# Patient Record
Sex: Female | Born: 1970 | Race: White | Hispanic: No | Marital: Married | Smoking: Never smoker
Health system: Southern US, Community
[De-identification: ages and names within clinical notes are randomized; demographics above are authoritative.]

## PROBLEM LIST (undated history)

## (undated) DIAGNOSIS — K3184 Gastroparesis: Secondary | ICD-10-CM

## (undated) DIAGNOSIS — K589 Irritable bowel syndrome without diarrhea: Secondary | ICD-10-CM

## (undated) DIAGNOSIS — Z9889 Other specified postprocedural states: Secondary | ICD-10-CM

## (undated) DIAGNOSIS — E041 Nontoxic single thyroid nodule: Secondary | ICD-10-CM

## (undated) DIAGNOSIS — Z803 Family history of malignant neoplasm of breast: Secondary | ICD-10-CM

## (undated) DIAGNOSIS — K219 Gastro-esophageal reflux disease without esophagitis: Secondary | ICD-10-CM

## (undated) DIAGNOSIS — Z8639 Personal history of other endocrine, nutritional and metabolic disease: Secondary | ICD-10-CM

## (undated) DIAGNOSIS — R110 Nausea: Secondary | ICD-10-CM

## (undated) DIAGNOSIS — J302 Other seasonal allergic rhinitis: Secondary | ICD-10-CM

## (undated) DIAGNOSIS — R112 Nausea with vomiting, unspecified: Secondary | ICD-10-CM

## (undated) DIAGNOSIS — E039 Hypothyroidism, unspecified: Secondary | ICD-10-CM

## (undated) HISTORY — DX: Gastroparesis: K31.84

## (undated) HISTORY — PX: WISDOM TOOTH EXTRACTION: SHX21

## (undated) HISTORY — PX: THYROIDECTOMY, PARTIAL: SHX18

## (undated) HISTORY — DX: Irritable bowel syndrome without diarrhea: K58.9

## (undated) HISTORY — PX: CHOLECYSTECTOMY: SHX55

## (undated) HISTORY — PX: EYE SURGERY: SHX253

## (undated) HISTORY — DX: Personal history of other endocrine, nutritional and metabolic disease: Z86.39

## (undated) HISTORY — DX: Nausea: R11.0

---
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
## 2001-07-02 ENCOUNTER — Other Ambulatory Visit: Admission: RE | Admit: 2001-07-02 | Discharge: 2001-07-02 | Payer: Self-pay | Admitting: Obstetrics and Gynecology

## 2002-01-27 ENCOUNTER — Other Ambulatory Visit: Admission: RE | Admit: 2002-01-27 | Discharge: 2002-01-27 | Payer: Self-pay | Admitting: Obstetrics and Gynecology

## 2003-03-19 ENCOUNTER — Other Ambulatory Visit: Admission: RE | Admit: 2003-03-19 | Discharge: 2003-03-19 | Payer: Self-pay | Admitting: Obstetrics and Gynecology

## 2003-11-19 ENCOUNTER — Other Ambulatory Visit: Admission: RE | Admit: 2003-11-19 | Discharge: 2003-11-19 | Payer: Self-pay | Admitting: Obstetrics and Gynecology

## 2004-02-01 ENCOUNTER — Inpatient Hospital Stay (HOSPITAL_COMMUNITY): Admission: AD | Admit: 2004-02-01 | Discharge: 2004-02-02 | Payer: Self-pay | Admitting: Obstetrics and Gynecology

## 2004-04-05 ENCOUNTER — Inpatient Hospital Stay (HOSPITAL_COMMUNITY): Admission: RE | Admit: 2004-04-05 | Discharge: 2004-04-08 | Payer: Self-pay | Admitting: Obstetrics and Gynecology

## 2004-05-13 ENCOUNTER — Inpatient Hospital Stay (HOSPITAL_COMMUNITY): Admission: AD | Admit: 2004-05-13 | Discharge: 2004-05-16 | Payer: Self-pay | Admitting: Obstetrics and Gynecology

## 2004-05-14 ENCOUNTER — Encounter (INDEPENDENT_AMBULATORY_CARE_PROVIDER_SITE_OTHER): Payer: Self-pay | Admitting: Specialist

## 2004-05-19 ENCOUNTER — Inpatient Hospital Stay (HOSPITAL_COMMUNITY): Admission: AD | Admit: 2004-05-19 | Discharge: 2004-05-19 | Payer: Self-pay

## 2004-05-21 ENCOUNTER — Encounter: Admission: RE | Admit: 2004-05-21 | Discharge: 2004-06-20 | Payer: Self-pay | Admitting: Obstetrics and Gynecology

## 2004-06-23 ENCOUNTER — Other Ambulatory Visit: Admission: RE | Admit: 2004-06-23 | Discharge: 2004-06-23 | Payer: Self-pay | Admitting: Obstetrics and Gynecology

## 2004-08-25 ENCOUNTER — Ambulatory Visit: Payer: Self-pay | Admitting: Family Medicine

## 2004-12-14 ENCOUNTER — Ambulatory Visit: Payer: Self-pay | Admitting: Internal Medicine

## 2005-02-23 ENCOUNTER — Ambulatory Visit: Payer: Self-pay | Admitting: Internal Medicine

## 2005-04-03 ENCOUNTER — Other Ambulatory Visit: Admission: RE | Admit: 2005-04-03 | Discharge: 2005-04-03 | Payer: Self-pay | Admitting: Obstetrics and Gynecology

## 2005-06-27 ENCOUNTER — Encounter: Admission: RE | Admit: 2005-06-27 | Discharge: 2005-06-27 | Payer: Self-pay | Admitting: Family Medicine

## 2005-06-27 ENCOUNTER — Ambulatory Visit: Payer: Self-pay | Admitting: Family Medicine

## 2005-09-27 ENCOUNTER — Ambulatory Visit: Payer: Self-pay | Admitting: Internal Medicine

## 2005-10-23 ENCOUNTER — Other Ambulatory Visit: Admission: RE | Admit: 2005-10-23 | Discharge: 2005-10-23 | Payer: Self-pay | Admitting: Obstetrics and Gynecology

## 2005-11-29 ENCOUNTER — Ambulatory Visit: Payer: Self-pay | Admitting: Internal Medicine

## 2005-11-30 ENCOUNTER — Ambulatory Visit: Payer: Self-pay | Admitting: Internal Medicine

## 2006-03-20 ENCOUNTER — Ambulatory Visit: Payer: Self-pay | Admitting: Internal Medicine

## 2006-03-21 ENCOUNTER — Emergency Department (HOSPITAL_COMMUNITY): Admission: EM | Admit: 2006-03-21 | Discharge: 2006-03-22 | Payer: Self-pay | Admitting: Emergency Medicine

## 2006-03-23 ENCOUNTER — Inpatient Hospital Stay (HOSPITAL_COMMUNITY): Admission: EM | Admit: 2006-03-23 | Discharge: 2006-03-25 | Payer: Self-pay | Admitting: Emergency Medicine

## 2006-03-25 ENCOUNTER — Encounter (INDEPENDENT_AMBULATORY_CARE_PROVIDER_SITE_OTHER): Payer: Self-pay | Admitting: Specialist

## 2006-05-02 ENCOUNTER — Encounter: Admission: RE | Admit: 2006-05-02 | Discharge: 2006-05-02 | Payer: Self-pay | Admitting: Gastroenterology

## 2006-06-26 ENCOUNTER — Ambulatory Visit: Payer: Self-pay | Admitting: Internal Medicine

## 2007-06-17 ENCOUNTER — Encounter: Payer: Self-pay | Admitting: Internal Medicine

## 2007-08-06 ENCOUNTER — Ambulatory Visit: Payer: Self-pay | Admitting: Internal Medicine

## 2007-08-06 DIAGNOSIS — K219 Gastro-esophageal reflux disease without esophagitis: Secondary | ICD-10-CM | POA: Insufficient documentation

## 2007-08-06 DIAGNOSIS — E039 Hypothyroidism, unspecified: Secondary | ICD-10-CM

## 2007-10-03 ENCOUNTER — Encounter (INDEPENDENT_AMBULATORY_CARE_PROVIDER_SITE_OTHER): Payer: Self-pay | Admitting: *Deleted

## 2007-10-03 ENCOUNTER — Ambulatory Visit: Payer: Self-pay | Admitting: Internal Medicine

## 2007-10-07 ENCOUNTER — Ambulatory Visit: Payer: Self-pay | Admitting: Internal Medicine

## 2007-10-07 ENCOUNTER — Encounter (INDEPENDENT_AMBULATORY_CARE_PROVIDER_SITE_OTHER): Payer: Self-pay | Admitting: *Deleted

## 2007-10-21 ENCOUNTER — Encounter: Payer: Self-pay | Admitting: Internal Medicine

## 2007-11-11 ENCOUNTER — Telehealth (INDEPENDENT_AMBULATORY_CARE_PROVIDER_SITE_OTHER): Payer: Self-pay | Admitting: *Deleted

## 2008-06-01 ENCOUNTER — Ambulatory Visit: Payer: Self-pay | Admitting: Internal Medicine

## 2008-08-28 ENCOUNTER — Ambulatory Visit: Payer: Self-pay | Admitting: Family Medicine

## 2008-09-08 ENCOUNTER — Telehealth: Payer: Self-pay | Admitting: Internal Medicine

## 2008-11-26 ENCOUNTER — Telehealth (INDEPENDENT_AMBULATORY_CARE_PROVIDER_SITE_OTHER): Payer: Self-pay | Admitting: *Deleted

## 2008-12-11 ENCOUNTER — Ambulatory Visit: Payer: Self-pay | Admitting: Family Medicine

## 2008-12-15 ENCOUNTER — Ambulatory Visit: Payer: Self-pay | Admitting: Internal Medicine

## 2008-12-15 ENCOUNTER — Telehealth (INDEPENDENT_AMBULATORY_CARE_PROVIDER_SITE_OTHER): Payer: Self-pay | Admitting: *Deleted

## 2009-04-29 ENCOUNTER — Telehealth: Payer: Self-pay | Admitting: Internal Medicine

## 2009-06-30 ENCOUNTER — Ambulatory Visit: Payer: Self-pay | Admitting: Family

## 2009-07-06 ENCOUNTER — Encounter: Payer: Self-pay | Admitting: Internal Medicine

## 2009-09-06 ENCOUNTER — Ambulatory Visit: Payer: Self-pay | Admitting: Family

## 2009-09-06 ENCOUNTER — Ambulatory Visit (HOSPITAL_BASED_OUTPATIENT_CLINIC_OR_DEPARTMENT_OTHER): Admission: RE | Admit: 2009-09-06 | Discharge: 2009-09-06 | Payer: Self-pay | Admitting: Internal Medicine

## 2009-09-06 ENCOUNTER — Ambulatory Visit: Payer: Self-pay | Admitting: Diagnostic Radiology

## 2009-09-06 ENCOUNTER — Telehealth: Payer: Self-pay | Admitting: Family

## 2009-10-13 ENCOUNTER — Ambulatory Visit: Payer: Self-pay | Admitting: Family

## 2009-10-13 LAB — CONVERTED CEMR LAB
CO2: 28 meq/L (ref 19–32)
Chloride: 106 meq/L (ref 96–112)
Glucose, Bld: 91 mg/dL (ref 70–99)
Potassium: 4.1 meq/L (ref 3.5–5.1)
Sodium: 139 meq/L (ref 135–145)

## 2009-10-18 ENCOUNTER — Telehealth: Payer: Self-pay | Admitting: Family

## 2009-10-18 ENCOUNTER — Ambulatory Visit: Payer: Self-pay | Admitting: Family

## 2009-10-18 ENCOUNTER — Encounter: Admission: RE | Admit: 2009-10-18 | Discharge: 2009-10-18 | Payer: Self-pay | Admitting: Internal Medicine

## 2009-10-18 DIAGNOSIS — K3184 Gastroparesis: Secondary | ICD-10-CM

## 2009-10-18 LAB — CONVERTED CEMR LAB
ALT: 57 units/L — ABNORMAL HIGH (ref 0–35)
AST: 67 units/L — ABNORMAL HIGH (ref 0–37)
Amylase: 47 units/L (ref 27–131)
Basophils Absolute: 0 10*3/uL (ref 0.0–0.1)
Bilirubin, Direct: 0.1 mg/dL (ref 0.0–0.3)
Eosinophils Absolute: 0.1 10*3/uL (ref 0.0–0.7)
GFR calc non Af Amer: 65.85 mL/min (ref >60)
Lipase: 15 units/L (ref 11.0–59.0)
Lymphocytes Relative: 31.6 % (ref 12.0–46.0)
Lymphs Abs: 2.9 10*3/uL (ref 0.7–4.0)
MCHC: 32.9 g/dL (ref 30.0–36.0)
MCV: 83.3 fL (ref 78.0–100.0)
Neutrophils Relative %: 61 % (ref 43.0–77.0)
Potassium: 4.8 meq/L (ref 3.5–5.1)
RDW: 14.3 % (ref 11.5–14.6)
Sodium: 139 meq/L (ref 135–145)
Total Bilirubin: 0.3 mg/dL (ref 0.3–1.2)
Total Protein: 8 g/dL (ref 6.0–8.3)
WBC: 9.1 10*3/uL (ref 4.5–10.5)
hCG, Beta Chain, Quant, S: <0.5 milliintl units/mL

## 2009-10-19 ENCOUNTER — Encounter: Payer: Self-pay | Admitting: Family

## 2009-10-20 ENCOUNTER — Encounter: Payer: Self-pay | Admitting: Family Medicine

## 2009-10-20 ENCOUNTER — Telehealth: Payer: Self-pay | Admitting: Family

## 2009-10-20 ENCOUNTER — Ambulatory Visit: Payer: Self-pay | Admitting: Family

## 2009-10-20 LAB — CONVERTED CEMR LAB
AST: 52 units/L — ABNORMAL HIGH (ref 0–37)
Bilirubin, Direct: 0.1 mg/dL (ref 0.0–0.3)
Eosinophils Relative: 1.2 % (ref 0.0–5.0)
Hemoglobin: 13.1 g/dL (ref 12.0–15.0)
Lymphocytes Relative: 36.2 % (ref 12.0–46.0)
Lymphs Abs: 2.5 10*3/uL (ref 0.7–4.0)
Monocytes Absolute: 0.5 10*3/uL (ref 0.1–1.0)
Monocytes Relative: 7.7 % (ref 3.0–12.0)
Neutro Abs: 3.8 10*3/uL (ref 1.4–7.7)
Platelets: 227 10*3/uL (ref 150.0–400.0)
Total Bilirubin: 0.6 mg/dL (ref 0.3–1.2)

## 2009-10-21 ENCOUNTER — Encounter: Payer: Self-pay | Admitting: Internal Medicine

## 2009-10-21 ENCOUNTER — Telehealth (INDEPENDENT_AMBULATORY_CARE_PROVIDER_SITE_OTHER): Payer: Self-pay | Admitting: *Deleted

## 2009-10-21 LAB — CONVERTED CEMR LAB
Hep A IgM: NEGATIVE
Hep B C IgM: NEGATIVE

## 2009-10-22 LAB — CONVERTED CEMR LAB
HCV Ab: NEGATIVE
Hep A IgM: NEGATIVE

## 2009-10-29 ENCOUNTER — Telehealth: Payer: Self-pay | Admitting: Family

## 2009-10-29 ENCOUNTER — Encounter: Payer: Self-pay | Admitting: Family

## 2009-12-08 ENCOUNTER — Encounter: Payer: Self-pay | Admitting: Internal Medicine

## 2010-03-09 ENCOUNTER — Ambulatory Visit: Payer: Self-pay | Admitting: Family Medicine

## 2010-03-09 DIAGNOSIS — J309 Allergic rhinitis, unspecified: Secondary | ICD-10-CM | POA: Insufficient documentation

## 2010-03-28 ENCOUNTER — Ambulatory Visit: Payer: Self-pay | Admitting: Internal Medicine

## 2010-03-30 ENCOUNTER — Telehealth (INDEPENDENT_AMBULATORY_CARE_PROVIDER_SITE_OTHER): Payer: Self-pay | Admitting: *Deleted

## 2010-04-11 ENCOUNTER — Ambulatory Visit (HOSPITAL_COMMUNITY): Admission: RE | Admit: 2010-04-11 | Discharge: 2010-04-11 | Payer: Self-pay | Admitting: Obstetrics and Gynecology

## 2010-04-14 ENCOUNTER — Telehealth: Payer: Self-pay | Admitting: Internal Medicine

## 2010-05-02 ENCOUNTER — Ambulatory Visit: Payer: Self-pay | Admitting: Internal Medicine

## 2010-05-02 DIAGNOSIS — R5381 Other malaise: Secondary | ICD-10-CM

## 2010-05-02 DIAGNOSIS — F329 Major depressive disorder, single episode, unspecified: Secondary | ICD-10-CM

## 2010-05-02 DIAGNOSIS — R5383 Other fatigue: Secondary | ICD-10-CM

## 2010-05-03 ENCOUNTER — Encounter: Payer: Self-pay | Admitting: Internal Medicine

## 2010-05-03 ENCOUNTER — Telehealth: Payer: Self-pay | Admitting: Internal Medicine

## 2010-05-05 ENCOUNTER — Ambulatory Visit: Payer: Self-pay | Admitting: Internal Medicine

## 2010-05-09 LAB — CONVERTED CEMR LAB
ALT: 52 units/L — ABNORMAL HIGH (ref 0–35)
AST: 79 units/L — ABNORMAL HIGH (ref 0–37)
Albumin: 4.1 g/dL (ref 3.5–5.2)
Alkaline Phosphatase: 55 units/L (ref 39–117)
Basophils Absolute: 0 10*3/uL (ref 0.0–0.1)
Basophils Relative: 0.6 % (ref 0.0–3.0)
Bilirubin, Direct: 0.1 mg/dL (ref 0.0–0.3)
Eosinophils Absolute: 0 10*3/uL (ref 0.0–0.7)
Eosinophils Relative: 0.6 % (ref 0.0–5.0)
Free T4: 1.13 ng/dL (ref 0.60–1.60)
HCT: 38.3 % (ref 36.0–46.0)
Hemoglobin: 12.8 g/dL (ref 12.0–15.0)
Lymphocytes Relative: 52.7 % — ABNORMAL HIGH (ref 12.0–46.0)
Lymphs Abs: 3.8 10*3/uL (ref 0.7–4.0)
MCHC: 33.3 g/dL (ref 30.0–36.0)
MCV: 81.5 fL (ref 78.0–100.0)
Monocytes Absolute: 0.9 10*3/uL (ref 0.1–1.0)
Monocytes Relative: 13 % — ABNORMAL HIGH (ref 3.0–12.0)
Neutro Abs: 2.4 10*3/uL (ref 1.4–7.7)
Neutrophils Relative %: 33.1 % — ABNORMAL LOW (ref 43.0–77.0)
Platelets: 248 10*3/uL (ref 150.0–400.0)
RBC: 4.7 M/uL (ref 3.87–5.11)
RDW: 14.4 % (ref 11.5–14.6)
T3, Free: 2.9 pg/mL (ref 2.3–4.2)
TSH: 0.19 microintl units/mL — ABNORMAL LOW (ref 0.35–5.50)
Total Bilirubin: 0.4 mg/dL (ref 0.3–1.2)
Total Protein: 7.8 g/dL (ref 6.0–8.3)
WBC: 7.1 10*3/uL (ref 4.5–10.5)

## 2010-05-10 ENCOUNTER — Ambulatory Visit: Payer: Self-pay | Admitting: Internal Medicine

## 2010-05-10 DIAGNOSIS — R740 Nonspecific elevation of levels of transaminase and lactic acid dehydrogenase [LDH]: Secondary | ICD-10-CM

## 2010-05-11 LAB — CONVERTED CEMR LAB
ALT: 78 units/L — ABNORMAL HIGH (ref 0–35)
AST: 91 units/L — ABNORMAL HIGH (ref 0–37)
Alkaline Phosphatase: 57 units/L (ref 39–117)
BUN: 12 mg/dL (ref 6–23)
Bilirubin, Direct: 0.2 mg/dL (ref 0.0–0.3)
Chloride: 108 meq/L (ref 96–112)
Eosinophils Absolute: 0 10*3/uL (ref 0.0–0.7)
GFR calc non Af Amer: 68 mL/min (ref >60)
HCT: 38.5 % (ref 36.0–46.0)
Hemoglobin: 12.9 g/dL (ref 12.0–15.0)
MCHC: 33.5 g/dL (ref 30.0–36.0)
Neutro Abs: 5 10*3/uL (ref 1.4–7.7)
Platelets: 249 10*3/uL (ref 150.0–400.0)
Potassium: 4.2 meq/L (ref 3.5–5.1)
RBC: 4.74 M/uL (ref 3.87–5.11)
RDW: 14.1 % (ref 11.5–14.6)
Total Bilirubin: 0.8 mg/dL (ref 0.3–1.2)

## 2010-05-12 ENCOUNTER — Encounter (INDEPENDENT_AMBULATORY_CARE_PROVIDER_SITE_OTHER): Payer: Self-pay | Admitting: *Deleted

## 2010-05-12 ENCOUNTER — Encounter: Payer: Self-pay | Admitting: Internal Medicine

## 2010-05-12 ENCOUNTER — Telehealth (INDEPENDENT_AMBULATORY_CARE_PROVIDER_SITE_OTHER): Payer: Self-pay | Admitting: *Deleted

## 2010-05-16 ENCOUNTER — Telehealth: Payer: Self-pay | Admitting: Internal Medicine

## 2010-05-16 ENCOUNTER — Encounter: Payer: Self-pay | Admitting: Internal Medicine

## 2010-05-31 ENCOUNTER — Ambulatory Visit: Payer: Self-pay | Admitting: Internal Medicine

## 2010-05-31 ENCOUNTER — Encounter (INDEPENDENT_AMBULATORY_CARE_PROVIDER_SITE_OTHER): Payer: Self-pay | Admitting: *Deleted

## 2010-06-03 ENCOUNTER — Encounter: Admission: RE | Admit: 2010-06-03 | Discharge: 2010-06-03 | Payer: Self-pay | Admitting: Gastroenterology

## 2010-06-08 ENCOUNTER — Encounter: Payer: Self-pay | Admitting: Internal Medicine

## 2010-06-08 ENCOUNTER — Ambulatory Visit: Payer: Self-pay | Admitting: Family Medicine

## 2010-06-10 ENCOUNTER — Telehealth: Payer: Self-pay | Admitting: Internal Medicine

## 2010-06-15 ENCOUNTER — Telehealth: Payer: Self-pay | Admitting: Internal Medicine

## 2010-06-15 ENCOUNTER — Ambulatory Visit: Payer: Self-pay | Admitting: Internal Medicine

## 2010-06-20 ENCOUNTER — Encounter (INDEPENDENT_AMBULATORY_CARE_PROVIDER_SITE_OTHER): Payer: Self-pay | Admitting: *Deleted

## 2010-06-20 ENCOUNTER — Ambulatory Visit: Payer: Self-pay | Admitting: Internal Medicine

## 2010-06-22 ENCOUNTER — Ambulatory Visit: Payer: Self-pay | Admitting: Cardiovascular Disease

## 2010-06-27 ENCOUNTER — Telehealth: Payer: Self-pay | Admitting: Internal Medicine

## 2010-06-28 ENCOUNTER — Telehealth: Payer: Self-pay | Admitting: Internal Medicine

## 2010-07-20 ENCOUNTER — Telehealth (INDEPENDENT_AMBULATORY_CARE_PROVIDER_SITE_OTHER): Payer: Self-pay | Admitting: *Deleted

## 2010-09-03 ENCOUNTER — Other Ambulatory Visit: Payer: Self-pay | Admitting: Endocrinology

## 2010-09-03 DIAGNOSIS — E041 Nontoxic single thyroid nodule: Secondary | ICD-10-CM

## 2010-09-13 NOTE — Progress Notes (Signed)
Summary: Still coughing  Phone Note Call from Patient Call back at Home Phone [phone] Call back at Work Phone [phone]   Caller: Patient Summary of Call: Pt states that she is finished with the ATB and her cough has gotten a lot better. But at night she is still having to take the Tussonex to keep from coughing at night. She states that the air quality at work is not the best. They are dealing with some really old files that could have some mold on them. Should she continue with the Mucinex DM and the cough syrup at night or should she come back in and be reevaluated. Army Fossa CMA  April 14, 2010 1:23 PM   Follow-up for Phone Call        yes, continue w/ mucinex dm and the syrup. also get samples of symbicort (80 if available, if not 160) 2 puffs two times a day x 2 weeks Leniyah Martell E. Jaden Abreu MD  April 15, 2010 8:10 AM   Additional Follow-up for Phone Call Additional follow up Details #1::        I spoke with pt she is aware, samples upfront. Army Fossa CMA  April 15, 2010 8:51 AM     New/Updated Medications: SYMBICORT 80-4.5 MCG/ACT AERO (BUDESONIDE-FORMOTEROL FUMARATE) 2 puffs two times a day for 2 weeks. Prescriptions: SYMBICORT 80-4.5 MCG/ACT AERO (BUDESONIDE-FORMOTEROL FUMARATE) 2 puffs two times a day for 2 weeks.  #1 x 0   Entered by:   Army Fossa CMA   Authorized by:   Nolon Rod. Hikaru Delorenzo MD   Signed by:   Army Fossa CMA on 04/15/2010   Method used:   Samples Given   RxID:   [id number]

## 2010-09-13 NOTE — Progress Notes (Signed)
Summary: recommendations  Phone Note Call from Patient Call back at Home Phone [phone]   Summary of Call: Patient left message on triage that she did see Dr. Bosie Clos and her CT, endoscopy, and colonoscopy were all negative. Patient would like to know what you want to do in regards to the Fluoxetine. Patient also notes that she still has N&V. Please advise. Initial call taken by: Lucious Groves CMA,  June 10, 2010 11:43 AM  Follow-up for Phone Call         decrease fluoxetine to half tablet for 10 days, then discontinue. We'll see how she does without it Jose E. Paz MD  June 10, 2010 12:57 PM   Additional Follow-up for Phone Call Additional follow up Details #1::        Patient notified.  Additional Follow-up by: Lucious Groves CMA,  June 10, 2010 2:14 PM

## 2010-09-13 NOTE — Assessment & Plan Note (Signed)
Summary: still not feeling well/throwing up//lch   Vital Signs:  Patient profile:   40 year old female Weight:      170 pounds BMI:     30.46 Temp:     99.7 degrees F p Pulse rate:   100 / minute Pulse rhythm:   regular Resp:     16 per minute BP sitting:   90 / 60  (left arm) Cuff size:   large  Vitals Entered By: Mervin Kung CMA (October 18, 2009 11:19 AM) CC: room 17  Vomiting since Sunday.   CC:  room 17  Vomiting since Sunday.Marland Kitchen  History of Present Illness: Ms Appleton is a 40 year old female who presents today with ongoing nausea, and vomitting.  Denies diarrhea.  Has been using zofran which "dulls" the nausea, but still having episodes of vomitting. She notes that wed through saturday had nausea without vomitting.  Had recurrent episode of vomitting last night.   Denies abdominal pain, diarrhea or cramping.  Husband had some nausea yesterday.  She is sipping 2-3 16 oz of bottled water, urinating regularly.  Notes +dry mouth.  Low grade temp.    Allergies: 1)  ! * Topical Hydrocortisone  Past History:  Past Medical History: Last updated: 08/06/2007 Hypothyroidism GERD  Past Surgical History: Last updated: 08/06/2007 Thyroidectomy, partial Cholecystectomy  Social History: Last updated: 12/15/2008 Married 2 girls, 64.40 years old - twins   Risk Factors: Smoking Status: never (08/06/2007)  Physical Exam  General:  Well-developed,well-nourished,in no acute distress; alert,appropriate and cooperative throughout examination Head:  Normocephalic and atraumatic without obvious abnormalities. No apparent alopecia or balding. Lungs:  Normal respiratory effort, chest expands symmetrically. Lungs are clear to auscultation, no crackles or wheezes. Heart:  Normal rate and regular rhythm. S1 and S2 normal without gallop, murmur, click, rub or other extra sounds. Abdomen:  Bowel sounds positive,abdomen soft and non-tender without masses, organomegaly or hernias  noted. Psych:  Cognition and judgment appear intact. Alert and cooperative with normal attention span and concentration. No apparent delusions, illusions, hallucinations   Impression & Recommendations:  Problem # 1:  NAUSEA AND VOMITING (ICD-787.01) Assessment Unchanged Pt with ongoing nausea and intermittent vomitting- low grade temp.  Pt is s/p cholecystectomy.  Abdominal US notes normal CBD, pancreas poorly visualized due to bowel gas.  LFT's are mildly elevated.  Will add on acute hepatitis panel to patient's lab work.  Patient is maintained on a PPI.  Amylase and lipase are normal.  I suspect that she is suffering from a prolonged viral gastroenteritis.  Plan f/u in 3 days.  Orthostatic BP's were checked in the room and patient did not demonstrated orthostasis.  Orders: Venipuncture ([address]) Misc. Referral (Misc. Ref) TLB-BMP (Basic Metabolic Panel-BMET) (80048-METABOL) TLB-CBC Platelet - w/Differential (85025-CBCD) TLB-Hepatic/Liver Function Pnl (80076-HEPATIC) TLB-Amylase (82150-AMYL) TLB-Lipase (83690-LIPASE) TLB-Preg Serum Quant (B-hCG) (84702-HCG-QN)  Complete Medication List: 1)  Levothroid 50 Mcg Tabs (Levothyroxine sodium) .Marland Kitchen.. 1 by mouth qd 2)  Protonix 40 Mg Tbec (Pantoprazole sodium) .... Qd 3)  Fluticasone Propionate 50 Mcg/act Susp (Fluticasone propionate) .... 2 sprays each nostril once daily 4)  Loratadine 10 Mg Tabs (Loratadine) .... Once daily as needed 5)  Ovcon-50 50-1 Mcg-mg Tabs (Norethindrone-eth estradiol) .Marland Kitchen.. 1 by mouth daily 6)  Zofran 4 Mg Tabs (Ondansetron hcl) .... One tablet by mouth every 8 hour as needed for nausea  Patient Instructions: 1)  Please complete your ultrasound today.  2)  Follow up on Wednesday 3/9, if symptoms worsen between now  and then, please go directly to the ER.  Current Allergies (reviewed today): ! * TOPICAL HYDROCORTISONE

## 2010-09-13 NOTE — Procedures (Signed)
Summary: Colonoscopy---neg   Colonoscopy/[facility]   Imported By: Lanelle Bal 07/12/2010 09:44:52  _____________________________________________________________________  External Attachment:    Type:   Image     Comment:   External Document

## 2010-09-13 NOTE — Assessment & Plan Note (Signed)
Summary: DISCUSS DEPRESSION,MEDS AND SIDE EFFECTS, AND RETURNING TO WO...   Vital Signs:  Patient profile:   40 year old female Weight:      161.25 pounds Pulse rate:   126 / minute Pulse rhythm:   regular BP sitting:   126 / 86  (left arm) Cuff size:   large  Vitals Entered By: Army Fossa CMA (June 20, 2010 11:24 AM) CC: Pt here for f/u on meds.  Comments Still having nausea spells- not as bad Not vomitted since friday not fasting    History of Present Illness: CHART REVIEWED:  3-11 prolonged nausea vomiting, abdominal ultrasound negative a except for fatty liver, LFTs  elevated, acute hepatitis and is negative; saw  GI, PPIs were switched. symptoms eventually resolved  05-02-2010 was seen in the office with emotional distress, started fluoxetine 05-06-2010 started with watery diarrhea,  mild nausea 05-10-2010, saw Dr Alwyn Ren with above  symptoms, extensive blood work showed again increased LFTs, stool studies negative 05-16-10 saw  GI, they prescribed align  05-22-10 diarrhea finally subsided 10-10 to  10-14 she felt relatively well and went to work 05-28-10 GI symptoms resurface: Nausea, vomiting,   moderate to severe acid reflux The patient called GI, they increase Protonix to b.i.d. and prescribed Zofran we added align-bentyl 06-10-10 patient called, reportedly Dr. Bosie Clos did  blood tests,  CT of the abdomen,  EGD (HH, stable, bx showed "inflamation"), and colonoscopy were all negative.  b/c her symptoms continue, I rec to to tapper down fluoxetine 06-14-10 saw endocrinology for thyroid dz, labs done  SHE IS HERE FOR F/U over all feels better Decrease nausea, still uses  Zofran as needed Has not vomited for 3 days No diarrhea, stools are slightly loose still No headaches in 4 days She still has hot  flashes ,  described as episodes of sweating  Current Medications (verified): 1)  Levothroid 50 Mcg Tabs (Levothyroxine Sodium) .Marland Kitchen.. 1 By Mouth Once Daily 2)   Protonix 40 Mg  Tbec (Pantoprazole Sodium) .... Twice A Day 3)  Fluticasone Propionate 50 Mcg/act  Susp (Fluticasone Propionate) .... 2 Sprays Each Nostril Once Daily 4)  Loratadine 10 Mg Tabs (Loratadine) .... Once Daily As Needed 5)  Ovcon-50 50-1 Mcg-Mg Tabs (Norethindrone-Eth Estradiol) .Marland Kitchen.. 1 By Mouth Daily 6)  Fluoxetine Hcl 20 Mg Tabs (Fluoxetine Hcl) .... Half Tablet Daily For One Week, Then One Tablet Daily 7)  Zofran 4 Mg Tabs (Ondansetron Hcl) .... As Needed 8)  Bentyl 10 Mg Caps (Dicyclomine Hcl) .Marland Kitchen.. 1 or 2 By Mouth Every 6 Hours As Needed For Nausea or Abdominal Discomfort 9)  Align  Caps (Probiotic Product) .... One Daily  Allergies (verified): 1)  ! * Topical Hydrocortisone  Past History:  Past Medical History: Reviewed history from 05/02/2010 and no changes required. Hypothyroidism GERD Allergic rhinitis Depression  Past Surgical History: Reviewed history from 08/06/2007 and no changes required. Thyroidectomy, partial Cholecystectomy  Social History: Reviewed history from 05/02/2010 and no changes required. Married 2 girls, 2005 twins  has a FT job   Review of Systems       denies any unusual stress at work or at home  Physical Exam  General:  alert and well-developed.   Abdomen:  soft, non-tender, normal bowel sounds, no distention, no masses, no guarding, no rigidity, and no rebound tenderness.     Impression & Recommendations:  Problem # 1:  NAUSEA AND VOMITING (ICD-787.01) decreasing  symptoms GI workup negative, reportedly she had gastritis (see history  of present illness) I'm concerned about the nausea along with the headaches with essentially negative GI workup except for mild gastritis. I'm recommending a CT of the head to rule out a problem there that could account for her nausea.  Orders: Radiology Referral (Radiology)  Problem # 2:  DEPRESSION (ICD-311) she is decreasing fluoxetine from 20 to 10 mg daily and coincidentally she is  feeling better (b/c decreasing SSRIs? b/c  Increasing PPIs?) Plan discontinue fluoxetine and observe. Consider a different SSRI  The following medications were removed from the medication list:    Fluoxetine Hcl 20 Mg Tabs (Fluoxetine hcl) ..... Half tablet daily for one week, then one tablet daily  Problem # 3:  as far as work she is currently on STD, today she is unsure if she could go back to work. I asked her to call me back in one week, if she is ready to work she will do that  Problem # 4:  HYPOTHYROIDISM (ICD-244.9) just saw endocrinoligy, will let them handle her thyroid problems  Her updated medication list for this problem includes:    Levothroid 50 Mcg Tabs (Levothyroxine sodium) .Marland Kitchen... 1 by mouth once daily  Problem # 5:  NONSPEC ELEVATION OF LEVELS OF TRANSAMINASE/LDH (ICD-790.4) reportedly GI did blood work since the last OV here  Complete Medication List: 1)  Levothroid 50 Mcg Tabs (Levothyroxine sodium) .Marland Kitchen.. 1 by mouth once daily 2)  Protonix 40 Mg Tbec (Pantoprazole sodium) .... Twice a day 3)  Fluticasone Propionate 50 Mcg/act Susp (Fluticasone propionate) .... 2 sprays each nostril once daily 4)  Loratadine 10 Mg Tabs (Loratadine) .... Once daily as needed 5)  Ovcon-50 50-1 Mcg-mg Tabs (Norethindrone-eth estradiol) .Marland Kitchen.. 1 by mouth daily 6)  Zofran 4 Mg Tabs (Ondansetron hcl) .... As needed 7)  Bentyl 10 Mg Caps (Dicyclomine hcl) .Marland Kitchen.. 1 or 2 by mouth every 6 hours as needed for nausea or abdominal discomfort 8)  Align Caps (Probiotic product) .... One daily  Patient Instructions: 1)  call in one week and tell us if you're ready to go back to work or not 2)  return to the office in one month Prescriptions: ZOFRAN 4 MG TABS (ONDANSETRON HCL) as needed  #30 x 0   Entered and Authorized by:   Elita Quick E. Ollivander See MD   Signed by:   Nolon Rod. Harmon Bommarito MD on 06/20/2010   Method used:   Electronically to        [facility]. [address] [phone]* (retail)       [address]        [place], [place]  [address]       Ph: [phone] or [phone]       Fax: [phone]   RxID:   [id number]    Orders Added: 1)  Radiology Referral [Radiology] 2)  Est. Patient Level III [phone]

## 2010-09-13 NOTE — Letter (Signed)
Summary: Out of Work  [facility] [employer]  [address] [place], [place] [address]   Phone: [phone]  Fax: [phone]    May 12, 2010   Employee:  VERDA MEHTA    To Whom It May Concern:   For Medical reasons, please excuse the above named employee from work for the following dates:  Start:   05/09/2010  End:   05/16/2010  If you need additional information, please feel free to contact our office.         Sincerely,    Marga Melnick, MD

## 2010-09-13 NOTE — Progress Notes (Signed)
Summary: Back to work  ---- Converted from flag ---- ---- 06/20/2010 6:30 PM, Nolon Rod. Paz MD wrote: check on her, ready to go back to work? (see last OV) ------------------------------  I spoke with pt she states that diarrhea is completely gone. Not vomitted since Tuesday. Nausea has eased up. Ate well over the weekend. She states she would like to go back to work on Wednesday. Army Fossa CMA  June 27, 2010 10:05 AM  agree , could  she could go back tomorrow? Jose E. Paz MD  June 27, 2010 11:37 AM    Pt states that she would prefer to wait until Wednesday. She would like to ease back into so she does not wear herself out. Army Fossa CMA  June 27, 2010 11:40 AM

## 2010-09-13 NOTE — Progress Notes (Signed)
Summary: still having diarrhea/work? - FYI   Phone Note Call from Patient Call back at [facility] Phone [phone]   Caller: Patient Summary of Call: Discuss results with pt, she is still having diarrhea she is going to make an appt with Dr.Schooler her GI doc at [facility]. She is concerned on if she should go back to work tomorrow due to still having diarrhea. Please advise. Army Fossa CMA  May 16, 2010 10:50 AM   Follow-up for Phone Call        [place], you can keep her out or see her(see normal studies) Follow-up by: Marga Melnick MD,  May 16, 2010 1:08 PM  Additional Follow-up for Phone Call Additional follow up Details #1::        give 2 additional days, if no better she needs to be seen either here or at  GI Additional Follow-up by: [facility] E. Paz MD,  May 16, 2010 1:51 PM    Additional Follow-up for Phone Call Additional follow up Details #2::    Left message for pt to call back. Army Fossa CMA  May 16, 2010 2:01 PM   Additional Follow-up for Phone Call Additional follow up Details #3:: Details for Additional Follow-up Action Taken: Pt saw Dr.Schooler today and he states that it could be from all the various ATB's.  He wrote pt out for the rest of the week. He started her on a probiotic.  Additional Follow-up by: Army Fossa CMA,  May 16, 2010 2:31 PM

## 2010-09-13 NOTE — Assessment & Plan Note (Signed)
Summary: PROBLEMS WITH THYROID, VERY EMOTIONAL ON FUN, VERY FRUSTRATED.Karen KitchenMarland [name]   Vital Signs:  Patient profile:   40 year old female Weight:      168.38 pounds Pulse rate:   111 / minute Pulse rhythm:   regular BP sitting:   122 / 82  (left arm) Cuff size:   regular  Vitals Entered By: Army Fossa CMA (May 02, 2010 1:31 PM) CC: Pt here c/o issues with thyroid, very emotional.  Comments - Very Fatigued, no appetite, Just wants to sleep all the time. Her gyn sent her to an ENT for the nodules on ther thyroid.  ENT referred her to an endo, stated the nodules were to small.  Unable to see an Endo until Novermber 1st. Referral to Dr. Everardo All??    History of Present Illness: several months history of fatigue, lack of appetite, weight gain, feeling sleepy all the time, emotional. Symptoms are gradually getting worse  She has thyroid disease, a chest x-ray recently showed thyromegaly. She went to her gynecologist, they Rx a  ultrasound that showed very small thyroid nodules was referred to ENT, they felt that the nodules were very small and would not account for any of her symptoms They went ahead and increase Synthroid from 50 to 100 micrograms 10 days ago to see if that would make her feel better  ROS when asked if she was anxious and depressed she states "there is a lot going on" Her husband is very stressed by his  job, he was recently put on Cymbalta and is doing better Feels that her workload at her job is very high, does not spend enough time with her twins Also her husband had neck surgery in the summer and that was stressful Her own health has not been the best, she had GI issues and a persistent bronchitis lately. Bronchitis is finally better Denies classic symptoms of depression or anxiety  (sad, down, crying spells...except for today) rather feels quite frustrated with everything that is going on  Current Medications (verified): 1)  Levothroid 100 Mcg Tabs  (Levothyroxine Sodium) .Karen [name].. 1 By Mouth Qd 2)  Protonix 40 Mg  Tbec (Pantoprazole Sodium) .... Qd 3)  Fluticasone Propionate 50 Mcg/act  Susp (Fluticasone Propionate) .... 2 Sprays Each Nostril Once Daily 4)  Loratadine 10 Mg Tabs (Loratadine) .... Once Daily As Needed 5)  Ovcon-50 50-1 Mcg-Mg Tabs (Norethindrone-Eth Estradiol) .Karen [name].. 1 By Mouth Daily  Allergies: 1)  ! * Topical Hydrocortisone  Past History:  Past Medical History: Hypothyroidism GERD Allergic rhinitis Depression  Past Surgical History: Reviewed history from 08/06/2007 and no changes required. Thyroidectomy, partial Cholecystectomy  Social History: Married 2 girls, 2005 twins  has a FT job   Physical Exam  General:  alert, well-developed, and overweight-appearing.   Neck:  barely palpable thyroid, no nodular by  physical exam. Nontender Lungs:  normal respiratory effort, no intercostal retractions, no accessory muscle use, and normal breath sounds.   Heart:  normal rate, regular rhythm, and no murmur.   Psych:  Oriented X3 and good eye contact.coherent, cooperativ  but  patient was tearful throughout the entire visit.    Impression & Recommendations:  Problem # 1:  DEPRESSION (ICD-311)  I think most of her symptoms are related to depression/anxiety rather than a "thyroid imbalance" I told the patient my opinion, she agreed in a way that that may be the case We agreed to do a trial with medication, I think Prozac would be a good choice for her  Offered a referral to a counselor  Her updated medication list for this problem includes:    Fluoxetine Hcl 20 Mg Tabs (Fluoxetine hcl) ..... Half tablet daily for one week, then one tablet daily  Problem # 2:  HYPOTHYROIDISM (ICD-244.9) thyroid disease with a recent ultrasound that showed very small nodules, status post ENT evaluation. Was not recommended surgery or anything of that nature She was taking thyroid 50 mcg daily, 10 days ago her dose was increased to  100 micrograms to see if that  help with her symptoms. last TSH per patient was 1.44 in August 2011 She wonders if T3 and T4 need to be checked Plan: Based on a TSH of 1.44 (on qd) our recommendation is  to take  75 micrograms daily Check TFTs keep endocrine appointment.  Her updated medication list for this problem includes:    Levothroid 50 Mcg Tabs (Levothyroxine sodium) .Karen [name]... 1.5 tablets daily  Orders: Venipuncture ([address]) TLB-T3, Free (Triiodothyronine) (84481-T3FREE) TLB-T4 (Thyrox), Free [phone]) TLB-TSH (Thyroid Stimulating Hormone) (84443-TSH) Specimen Handling ([address])  Problem # 3:  FATIGUE (ICD-780.79)  likely from #1, labs  Orders: TLB-CBC Platelet - w/Differential (85025-CBCD) TLB-Hepatic/Liver Function Pnl (80076-HEPATIC) Specimen Handling (82956)  Problem # 4:  F2F more than 25 minutes, more than 50% of the time counseling  Complete Medication List: 1)  Levothroid 50 Mcg Tabs (Levothyroxine sodium) .... 1.5 tablets daily 2)  Protonix 40 Mg Tbec (Pantoprazole sodium) .... Qd 3)  Fluticasone Propionate 50 Mcg/act Susp (Fluticasone propionate) .... 2 sprays each nostril once daily 4)  Loratadine 10 Mg Tabs (Loratadine) .... Once daily as needed 5)  Ovcon-50 50-1 Mcg-mg Tabs (Norethindrone-eth estradiol) .Karen [name].. 1 by mouth daily 6)  Fluoxetine Hcl 20 Mg Tabs (Fluoxetine hcl) .... Half tablet daily for one week, then one tablet daily  Patient Instructions: 1)  Please schedule a follow-up appointment in 1 month.  Prescriptions: FLUOXETINE HCL 20 MG TABS (FLUOXETINE HCL) half tablet daily for one week, then one tablet daily  #30 x 1   Entered and Authorized by:   Elita Quick E. Dierdra Salameh MD   Signed by:   Nolon Rod. Kortlynn Poust MD on 05/02/2010   Method used:   Print then Give to Patient   RxID:   [id number] LEVOTHROID 50 MCG TABS (LEVOTHYROXINE SODIUM) 1.5 tablets daily  #45 x 3   Entered and Authorized by:   Nolon Rod. Clydie Dillen MD   Signed by:   Nolon Rod. Lonza Shimabukuro MD on 05/02/2010    Method used:   Print then Give to Patient   RxID:   [id number]

## 2010-09-13 NOTE — Assessment & Plan Note (Signed)
Summary: cough/cbs   Vital Signs:  Patient profile:   40 year old female Height:      62.75 inches Weight:      172 pounds BMI:     30.82 O2 Sat:      99 % on Room air Temp:     98.7 degrees F oral Pulse rate:   80 / minute BP sitting:   90 / 72  (left arm)  Vitals Entered By: Jeremy Johann CMA (March 09, 2010 11:28 AM)  O2 Flow:  Room air CC: dry cough, nasal congestion, drainage, URI symptoms   History of Present Illness:       This is a 40 year old woman who presents with URI symptoms.  The symptoms began 1 week ago.  Pt taking otc multisymptom syrup with little relief.  The patient complains of nasal congestion and dry cough, but denies clear nasal discharge, purulent nasal discharge, sore throat, productive cough, earache, and sick contacts.  The patient denies fever, low-grade fever (<100.5 degrees), fever of 100.5-103 degrees, fever of 103.1-104 degrees, fever to >104 degrees, stiff neck, dyspnea, wheezing, rash, vomiting, diarrhea, use of an antipyretic, and response to antipyretic.  The patient denies itchy watery eyes, itchy throat, sneezing, seasonal symptoms, response to antihistamine, headache, muscle aches, and severe fatigue.  The patient denies the following risk factors for Strep sinusitis: unilateral facial pain, unilateral nasal discharge, poor response to decongestant, double sickening, tooth pain, Strep exposure, tender adenopathy, and absence of cough.    Current Medications (verified): 1)  Levothroid 50 Mcg  Tabs (Levothyroxine Sodium) .Marland Kitchen.. 1 By Mouth Qd 2)  Protonix 40 Mg  Tbec (Pantoprazole Sodium) .... Qd 3)  Fluticasone Propionate 50 Mcg/act  Susp (Fluticasone Propionate) .... 2 Sprays Each Nostril Once Daily 4)  Loratadine 10 Mg Tabs (Loratadine) .... Once Daily As Needed 5)  Ovcon-50 50-1 Mcg-Mg Tabs (Norethindrone-Eth Estradiol) .Marland Kitchen.. 1 By Mouth Daily  Allergies (verified): 1)  ! * Topical Hydrocortisone  Past History:  Past Medical  History: Hypothyroidism GERD Allergic rhinitis  Review of Systems      See HPI  Physical Exam  General:  Well-developed,well-nourished,in no acute distress; alert,appropriate and cooperative throughout examination Ears:  External ear exam shows no significant lesions or deformities.  Otoscopic examination reveals clear canals, tympanic membranes are intact bilaterally without bulging, retraction, inflammation or discharge. Hearing is grossly normal bilaterally. Nose:  External nasal examination shows no deformity or inflammation. Nasal mucosa are pink and moist without lesions or exudates. Mouth:  Oral mucosa and oropharynx without lesions or exudates.  Teeth in good repair. Neck:  No deformities, masses, or tenderness noted. Lungs:  Normal respiratory effort, chest expands symmetrically. Lungs are clear to auscultation, no crackles or wheezes. Heart:  normal rate and no murmur.   Psych:  Oriented X3 and normally interactive.     Impression & Recommendations:  Problem # 1:  BRONCHITIS- ACUTE (ICD-466.0)  Orders: Nebulizer Tx (21308)  Take antibiotics and other medications as directed. Encouraged to push clear liquids, get enough rest, and take acetaminophen as needed. To be seen in 5-7 days if no improvement, sooner if worse.  Her updated medication list for this problem includes:    Zithromax Z-pak 250 Mg Tabs (Azithromycin) .Marland Kitchen... As directed    Qvar 40 Mcg/act Aers (Beclomethasone dipropionate) .Marland Kitchen... 2 puffs two times a day  Complete Medication List: 1)  Levothroid 50 Mcg Tabs (Levothyroxine sodium) .Marland Kitchen.. 1 by mouth qd 2)  Protonix 40 Mg Tbec (Pantoprazole  sodium) .... Qd 3)  Fluticasone Propionate 50 Mcg/act Susp (Fluticasone propionate) .... 2 sprays each nostril once daily 4)  Loratadine 10 Mg Tabs (Loratadine) .... Once daily as needed 5)  Ovcon-50 50-1 Mcg-mg Tabs (Norethindrone-eth estradiol) .Marland Kitchen.. 1 by mouth daily 6)  Zithromax Z-pak 250 Mg Tabs (Azithromycin) .... As  directed 7)  Qvar 40 Mcg/act Aers (Beclomethasone dipropionate) .... 2 puffs two times a day  Patient Instructions: 1)  Acute Bronchitis symptoms for less then 10 days are not  helped by antibiotics. Take over the counter cough medications. Call if no improvement in 5-7 days, sooner if increasing cough, fever, or new symptoms ( shortness of breath, chest pain) .  Prescriptions: QVAR 40 MCG/ACT AERS (BECLOMETHASONE DIPROPIONATE) 2 puffs two times a day  #1 x 0   Entered and Authorized by:   Loreen Freud DO   Signed by:   Loreen Freud DO on 03/09/2010   Method used:   Historical   RxID:   [id number] ZITHROMAX Z-PAK 250 MG TABS (AZITHROMYCIN) as directed  #1 x 0   Entered and Authorized by:   Loreen Freud DO   Signed by:   Loreen Freud DO on 03/09/2010   Method used:   Electronically to        [facility] [phone]* (retail)       [address]       [place], [place]  [address]       Ph: [phone]       Fax: [phone]   RxID:   [id number]   Appended Document: Orders Update    Clinical Lists Changes  Orders: Added new Service order of Albuterol Sulfate Sol 1mg  unit dose (R5188) - Signed       Medication Administration  Medication # 1:    Medication: Albuterol Sulfate Sol 1mg  unit dose    Diagnosis: BRONCHITIS- ACUTE (ICD-466.0)    Route: inhaled    Exp Date: 06/15/2011    Lot #: [id number]    Mfr: nephron    Comments: 2.5mg /64ml given to pt    Patient tolerated medication without complications    Given by: Jeremy Johann CMA (March 09, 2010 12:09 PM)  Orders Added: 1)  Albuterol Sulfate Sol 1mg  unit dose [T0160]

## 2010-09-13 NOTE — Letter (Signed)
Summary: Out of Work  [facility]  [address] [place], [place] [address]   Phone: [phone]  Fax: [phone]    October 20, 2009   Employee:  Karen [name]    To Whom It May Concern:   For Medical reasons, please excuse the above named employee from work for the following dates:  Start:   10/20/09  End:   Monday 10/25/09  If you need additional information, please feel free to contact our office.         Sincerely,    Lemont Fillers FNP

## 2010-09-13 NOTE — Letter (Signed)
Summary: Out of Work  [facility]  [address] [place], [place] [address]   Phone: [phone]  Fax: [phone]    May 03, 2010   Employee:  ARIZBETH CAWTHORN    To Whom It May Concern:   For Medical reasons, please excuse the above named employee from work for the following dates:  Start:   05/02/2010  End:   05/09/2010  The above named employee may return to work on 05/10/2010. If you need additional information, please feel free to contact our office.         Sincerely,    [name], MD

## 2010-09-13 NOTE — Progress Notes (Signed)
Summary: Continued issues  Phone Note Call from Patient Call back at Home Phone [phone]   Reason for Call: Privacy/Consent Authorization Summary of Call: Patient left message on triage that she has been having possible side effects from the decrease in Fluoxetine. She notes that she has been having N&V, diarrhea, HA, hot flashes, and a "weird sensation" of her hands. Patient has not gone to work and will need a note. She would like to know if she should give it longer, come in for office visit, or if you have other recommendations. If patient is to give it longer she will need release from work for the rest of the week faxed to [employer] [phone].  Please advise. Initial call taken by: Lucious Groves CMA,  June 15, 2010 8:51 AM  Follow-up for Phone Call        it is hard to know what symptoms are related to decrease the dose of her medicines. I recommended staying on the lower dose of fluoxetine, followup early next week. Extended work excuse for one week Follow-up by: Quitman Norberto E. Cathey Fredenburg MD,  June 15, 2010 9:02 AM  Additional Follow-up for Phone Call Additional follow up Details #1::        Patient notified and work note sent. Additional Follow-up by: Lucious Groves CMA,  June 15, 2010 10:05 AM

## 2010-09-13 NOTE — Consult Note (Signed)
Summary: [facility]   Imported By: Lanelle Bal 11/02/2009 11:53:49  _____________________________________________________________________  External Attachment:    Type:   Image     Comment:   External Document

## 2010-09-13 NOTE — Progress Notes (Signed)
Summary: SE from ABX   Phone Note Call from Patient Call back at Home Phone [phone]   Caller: Patient Summary of Call: Patient called and wanted to inform us that she was put on a new abx this monday and everytime she takes it she feels nauseous. I asked if she was taking it with food and she said she was.  Would you like to change the ABX or see how she does taking it without the food? Initial call taken by: Harold Barban,  March 30, 2010 10:16 AM  Follow-up for Phone Call        -switch to amoxicillin 500 mg 2 tablets twice a day for one week -also, advised patient chest x-ray was okay but there was a question of enlarged thyroid.  -please scheduled  a Thyroid ultrasound (dx ?thyromegaly) -Needs office visit in 3 weeks to reassess her thyroid Jose E. Paz MD  March 30, 2010 11:14 AM   Additional Follow-up for Phone Call Additional follow up Details #1::        Pharmacy: [facility] on [address] and [address].  Patient is seeing her GYN 04/04/10 and he as been moitoring her thyroid levels there. She will speak with him about that and see what he wants to do as well. They also have [place] onsight too. She will call us after that appt with informaiton.  Additional Follow-up by: Harold Barban,  March 30, 2010 11:29 AM    Additional Follow-up for Phone Call Additional follow up Details #2::    ok , send CXR results to [facility] E. Paz MD  March 30, 2010 3:01 PM   Left message for pt to call back. All i need is to see who her GYN is, so that we can send her CXR results. Army Fossa CMA  March 30, 2010 3:03 PM  Pt sees Dr. Rana Snare. Army Fossa CMA  March 30, 2010 3:16 PM   New/Updated Medications: AMOXICILLIN 500 MG CAPS (AMOXICILLIN) 2 tablets twice a day for one week. Prescriptions: AMOXICILLIN 500 MG CAPS (AMOXICILLIN) 2 tablets twice a day for one week.  #28 x 0   Entered by:   Army Fossa CMA   Authorized by:   Nolon Rod. Paz MD   Signed by:   Army Fossa CMA on  03/30/2010   Method used:   Electronically to        [facility]. [address] [phone]* (retail)       [address]       [place], [place]  [address]       Ph: [phone] or [phone]       Fax: [phone]   RxID:   [id number]

## 2010-09-13 NOTE — Assessment & Plan Note (Signed)
Summary: stomach bug/kn   Vital Signs:  Patient profile:   40 year old female Weight:      159.50 pounds Temp:     99.7 degrees F oral Pulse rate:   111 / minute Pulse rhythm:   regular BP sitting:   126 / 80  (left arm) Cuff size:   large  Vitals Entered By: Army Fossa CMA (May 31, 2010 8:45 AM) CC: Pt here c/o vomitting since saturday, nauseas since friday. Comments [facility].    History of Present Illness: present today with nausea and vomiting. To understand her GI symptoms I review her chart for the last several months:  3-11 prolonged nausea vomiting, abdominal ultrasound negative a except for fatty liver, LFTs  elevated, acute hepatitis and is negative; saw  GI, PPIs were switched. symptoms eventually resolved  05-02-2010 was seen in the office with emotional distress, started fluoxetine 05-06-2010 started with watery diarrhea,  mild nausea 05-10-2010, saw Dr Alwyn Ren with above  symptoms, extensive blood work showed again increased LFTs, stool studies negative 05-16-10 saw  GI, they prescribed align  05-22-10 diarrhea finally subsided 10-10 to  10-14 she felt relatively well and went to work 05-28-10 GI symptoms resurface: Nausea, vomiting,   moderate to severe acid reflux The patient called GI, they increase Protonix to b.i.d. and prescribed Zofran Yesterday she had mild fever, less than 100  ROS No diarrhea, no abdominal pain  No other family members affected with similar symptoms Denies dysuria or hematuria no skin rash As far as anxiety and depression, again she started Prozac 9-19 and feels better she is just frustrated by her GI  symptoms As far as her thyroid medication, dose has been changed twice in the last few weeks, current dose of 50 mcg  Current Medications (verified): 1)  Levothroid 50 Mcg Tabs (Levothyroxine Sodium) .... 1.5 Tablets Daily 2)  Protonix 40 Mg  Tbec (Pantoprazole Sodium) .... Qd 3)  Fluticasone Propionate 50 Mcg/act  Susp  (Fluticasone Propionate) .... 2 Sprays Each Nostril Once Daily 4)  Loratadine 10 Mg Tabs (Loratadine) .... Once Daily As Needed 5)  Ovcon-50 50-1 Mcg-Mg Tabs (Norethindrone-Eth Estradiol) .Marland Kitchen.. 1 By Mouth Daily 6)  Fluoxetine Hcl 20 Mg Tabs (Fluoxetine Hcl) .... Half Tablet Daily For One Week, Then One Tablet Daily 7)  Zofran 4 Mg Tabs (Ondansetron Hcl) .... As Needed  Allergies (verified): 1)  ! * Topical Hydrocortisone  Past History:  Past Medical History: Reviewed history from 05/02/2010 and no changes required. Hypothyroidism GERD Allergic rhinitis Depression  Past Surgical History: Reviewed history from 08/06/2007 and no changes required. Thyroidectomy, partial Cholecystectomy  Social History: Reviewed history from 05/02/2010 and no changes required. Married 2 girls, 2005 twins  has a FT job   Physical Exam  General:  alert and well-developed.  nontoxic but seems tired Mouth:  slightly dry mouth Lungs:  Normal respiratory effort, chest expands symmetrically. Lungs are clear to auscultation, no crackles or wheezes. Heart:  Normal rate and regular rhythm. S1 and S2 normal without gallop, murmur, click, rub or other extra sounds. Abdomen:  soft, no distention, no masses, no guarding, and no rigidity.  mild tenderness of the upper abdomen bilaterally Extremities:  no edema Psych:  she doesn't seem to be anxious or depressed. She does seem frustrated by her stomach issues   Impression & Recommendations:  Problem # 1:  NAUSEA AND VOMITING (ICD-787.01) started with nausea and vomiting again few days ago along with increased GERD symptoms. No diarrhea  On exam, she is mildly tender in the upper abdomen Etiology unclear, PUD?, IBS?, GERD?, multifactorial? She started SSRIs 05-02-10, unclear if symptoms related to SSRIs because she had prolonged the GI symptoms before without taking fluoxetine. Plan: Continue with double Protonix Restart align bentyl Will discuss with  GI  ADDENDUM: discussed with GI, they will check on the patient, consider do a CT of the abdomen, LFTs, amylase and lipase if not better Current symptoms could still or be an  acute GI infection. From my standpoint, all check on her in few days, we'll consider discontinuing SSRIs Jaynee Winters E. Coron Rossano MD  June 01, 2010 12:44 PM  d/w patient  Nolon Rod. Coolidge Gossard MD  June 01, 2010 1:10 PM   Problem # 2:  DEPRESSION (ICD-311) responding to fluoxetine Her updated medication list for this problem includes:    Fluoxetine Hcl 20 Mg Tabs (Fluoxetine hcl) ..... Half tablet daily for one week, then one tablet daily  Problem # 3:  NONSPEC ELEVATION OF LEVELS OF TRANSAMINASE/LDH (ICD-790.4) chronically elevated LFTs 10-2009 abdominal ultrasound showed fatty liver and had a negative acute hepatitis panel plan: recheck after UGI symptoms resolve  Problem # 4:  HYPOTHYROIDISM (ICD-244.9) current dose 50 micrograms, recheck TSH on  return to the office Her updated medication list for this problem includes:    Levothroid 50 Mcg Tabs (Levothyroxine sodium) .Marland Kitchen... 1 by mouth once daily  Labs Reviewed: TSH: 0.19 (05/02/2010)     Complete Medication List: 1)  Levothroid 50 Mcg Tabs (Levothyroxine sodium) .Marland Kitchen.. 1 by mouth once daily 2)  Protonix 40 Mg Tbec (Pantoprazole sodium) .... Twice a day 3)  Fluticasone Propionate 50 Mcg/act Susp (Fluticasone propionate) .... 2 sprays each nostril once daily 4)  Loratadine 10 Mg Tabs (Loratadine) .... Once daily as needed 5)  Ovcon-50 50-1 Mcg-mg Tabs (Norethindrone-eth estradiol) .Marland Kitchen.. 1 by mouth daily 6)  Fluoxetine Hcl 20 Mg Tabs (Fluoxetine hcl) .... Half tablet daily for one week, then one tablet daily 7)  Zofran 4 Mg Tabs (Ondansetron hcl) .... As needed 8)  Bentyl 10 Mg Caps (Dicyclomine hcl) .Marland Kitchen.. 1 or 2 by mouth every 6 hours as needed for nausea or abdominal discomfort 9)  Align Caps (Probiotic product) .... One daily  Patient Instructions: 1)  see medication list  below 2)  Drink plenty of clear liquids 3)  blan  diet 4)  Please schedule a follow-up appointment in 3  weeks.  Prescriptions: BENTYL 10 MG CAPS (DICYCLOMINE HCL) 1 or 2 by mouth every 6 hours as needed for nausea or abdominal discomfort  #30 x 0   Entered and Authorized by:   Nolon Rod. Jaquavious Mercer MD   Signed by:   Nolon Rod. Sereen Schaff MD on 05/31/2010   Method used:   Electronically to        [facility]. [address] [phone]* (retail)       [address]       [place], [place]  [address]       Ph: [phone] or [phone]       Fax: [phone]   RxID:   [id number]    Orders Added: 1)  Est. Patient Level IV [74[address]]

## 2010-09-13 NOTE — Letter (Signed)
Summary: Out of Work  [facility]  [address] [place], [place] [address]   Phone: [phone]  Fax: [phone]    October 13, 2009   Employee:  CAYDANCE KUEHNLE    To Whom It May Concern:   For Medical reasons, please excuse the above named employee from work for the following dates:  Start:   10/11/09  End:   return 10/18/09  If you need additional information, please feel free to contact our office.         Sincerely,    Lemont Fillers FNP

## 2010-09-13 NOTE — Progress Notes (Signed)
  Phone Note Outgoing Call   Call placed by: Lemont Fillers FNP,  September 06, 2009 3:22 PM Summary of Call: called patient reviewed results of x-ray. Negative for fracture. Recommended NSAIDS,donut pillow PRN,  vicodin as needed severe pain,  time.  Can consider further imaging if pain worsens of if patient has  not seen improvement in 1 month.  Patient is agreeable to this plan Initial call taken by: Lemont Fillers FNP,  September 06, 2009 3:23 PM

## 2010-09-13 NOTE — Progress Notes (Signed)
Summary: Medication concern/absence request  Phone Note Call from Patient Call back at Home Phone [phone]   Summary of Call: Patient called stating that she did start the med given, but is having nausea, feeling fuzzy, and feeling drained and would like to know if this is normal. Patient did not go to work due to this and has spoken with her manager about it. The patient is requesting to be written out of work until she can adjust (at least this week) and also requests follow up with MD next week. Please advise. Initial call taken by: Lucious Groves CMA,  May 03, 2010 10:25 AM  Follow-up for Phone Call        okay to give work excuse for this week only Continue with Prozac as prescribed, nausea likely will go away as she continued the treatment. Fatigue "feeling drained" is a symptom she had to begin with, not likely related to Prozac. labs pending Follow-up by: Jose E. Paz MD,  May 03, 2010 2:39 PM  Additional Follow-up for Phone Call Additional follow up Details #1::        Patient notified of the above and now requests that the note write her out of work from yesterday through Monday (return to work on Tuesday) so that her Short term Disability will cover it (she does not have enough vacation time). Patient was made aware that this requires MD approval and would like the note faxed to her manager Maida Sale  # [phone].  Please advise. Additional Follow-up by: Lucious Groves CMA,  May 03, 2010 3:39 PM    Additional Follow-up for Phone Call Additional follow up Details #2::    ok Jose E. Paz MD  May 03, 2010 4:46 PM of  Additional Follow-up for Phone Call Additional follow up Details #3:: Details for Additional Follow-up Action Taken: Letter faxed, and patient notified. Additional Follow-up by: Lucious Groves CMA,  May 04, 2010 8:50 AM

## 2010-09-13 NOTE — Letter (Signed)
Summary: Generic Letter  [facility]  [address]   [place], [place] [address]   Phone: [phone]  Fax: [phone]    10/29/2009  Karen [name] [address] [place], [place]  [address]  To whom it may concern,  Karen [name] is medically cleared to return to work on Monday 3/21.  Please call if you have any questions.   Sincerely,   Sandford Craze FNP

## 2010-09-13 NOTE — Letter (Signed)
Summary: EGD neg  Letter with Upper GI Endoscopy Results/[facility]   Imported By: Lanelle Bal 07/14/2010 08:38:34  _____________________________________________________________________  External Attachment:    Type:   Image     Comment:   External Document

## 2010-09-13 NOTE — Letter (Signed)
Summary: Out of Work  [facility]  [address] [place], [place] [address]   Phone: [phone]  Fax: [phone]    May 31, 2010   Employee:  Karen [name]    To Whom It May Concern:   For Medical reasons, please excuse the above named employee from work for the following dates:  Start:   May 27, 2010  End:   October 24. 2011  If you need additional information, please feel free to contact our office.         Sincerely,    [name], MD

## 2010-09-13 NOTE — Progress Notes (Signed)
Summary: Records  Phone Note Call from Patient Call back at Home Phone [phone]   Summary of Call: Patient notes that she is going back to work tomorrow and was made aware her company required more info from our office. She notes that this is keeping her from getting paid.  Patient notified that this will be done by [facility], and she will call medical records to check on the status. Initial call taken by: Lucious Groves CMA,  June 28, 2010 4:08 PM

## 2010-09-13 NOTE — Progress Notes (Signed)
Summary: release back to work  pt called with info   Phone Note Call from Patient Call back at Home Phone [phone]   Caller: Patient Summary of Call: patient left msg on voicemail needs letter of release so that she can return to work on Mon. Initial call taken by: Doristine Devoid,  October 29, 2009 10:07 AM  Follow-up for Phone Call        Left message for patient to call us.  I will be happy to sign release for her to return to work.  Just need to make sure that she is feeling better and tolerating by mouth's without difficulty. Follow-up by: Lemont Fillers FNP,  October 29, 2009 12:12 PM  Additional Follow-up for Phone Call Additional follow up Details #1::        Today has been her best day.  The GI doc had her on meds that gave her diarrhea.  She still does not have an appetitie but she is eatting. Nausea is completely gone.  The GI doc had her stop Dexilant 60mg  today and go back to protonix.  He is hoping the nausea will stay away for good and the regular med will allow the diarrhea to stop.  Roselle Locus  October 29, 2009 1:32 PM  call [phone]    Additional Follow-up for Phone Call Additional follow up Details #2::    Pls call patient and let her know letter is ready for pick up or fax. Follow-up by: Lemont Fillers FNP,  October 29, 2009 1:52 PM  Additional Follow-up for Phone Call Additional follow up Details #3:: Details for Additional Follow-up Action Taken: Pt notified letter completed for release to work.  Pt request letter be faxed to supervisor, Delice Bison @ [phone]. Letter faxed @ 2:42. Additional Follow-up by: Mervin Kung CMA,  October 29, 2009 2:59 PM

## 2010-09-13 NOTE — Progress Notes (Signed)
Summary: Note for work  Phone Note Call from Patient Call back at [employer] [phone]   Caller: Patient Summary of Call: Spoke with pt regarding her labs. Pt is requesting to get a note to be out of work for the rest of the week. Please advise. Army Fossa CMA  May 12, 2010 11:59 AM   Follow-up for Phone Call        spoke w/ patient says that most of her symptoms have resolved but still having diarrhea says that medication giving helps w/ frequently but still w/ loose stools....Marland KitchenMarland KitchenDoristine Devoid CMA  May 12, 2010 12:12 PM   Additional Follow-up for Phone Call Additional follow up Details #1::        out of work until [place] due to diarrhea Additional Follow-up by: Marga Melnick MD,  May 12, 2010 1:20 PM    Additional Follow-up for Phone Call Additional follow up Details #2::    spoke w/ patient aware note ok to have until mon. will fax attn: Maida Sale fax: [phone].....Marland KitchenMarland KitchenDoristine Devoid CMA  May 12, 2010 3:22 PM

## 2010-09-13 NOTE — Progress Notes (Signed)
Summary: u/s, LFT results  Phone Note Outgoing Call   Summary of Call: Pls call patient and let her know [place] is negative- just notes fatty liver- she should work hard on low fat/low cholesterol diet and exercise when she is feeling better.  Her LFT's are up a bit, this may be due to viral gastroenteritis, but I am also going to check her for acute hepatitis.  She should keep scheduled apt.  Initial call taken by: Lemont Fillers FNP,  October 18, 2009 5:54 PM  Follow-up for Phone Call        Pt notified of results and voices understanding. Follow-up by: Mervin Kung CMA,  October 19, 2009 9:54 AM

## 2010-09-13 NOTE — Assessment & Plan Note (Signed)
Summary: 2 DAY RETURN VISIT///[facility]   Vital Signs:  Patient profile:   40 year old female Height:      62.75 inches Weight:      169.13 pounds BMI:     30.31 Temp:     97.9 degrees F oral Pulse rate:   76 / minute Pulse rhythm:   regular Resp:     16 per minute BP sitting:   110 / 86  (left arm) Cuff size:   regular  Vitals Entered By: Mervin Kung CMA (October 20, 2009 9:40 AM) CC: room 16   2 day follow up   CC:  room 16   2 day follow up.  History of Present Illness: Karen [name] is a 40 yr old female who presents today for follow up of her nausea with intermittent vomitting which has now been present x 10 days.  Denies diarrhea.  Denies vomitting since Monday.  Patient is s/p cholecystectomy, but abdominal ultrasound was unremarkable except incidental note of fatty liver.  Denies fever.  + nausea with gagging.  Anorexia- tolerating small amounts of food/liquid. Transaminases on 3/8 were noted to be slightly elevated.  She denies any other sick family members.  Has form for work (short term disability) which she requests to be filled.    Allergies: 1)  ! * Topical Hydrocortisone  Physical Exam  General:  Well-developed,well-nourished,in no acute distress; alert,appropriate and cooperative throughout examination Lungs:  Normal respiratory effort, chest expands symmetrically. Lungs are clear to auscultation, no crackles or wheezes. Heart:  Normal rate and regular rhythm. S1 and S2 normal without gallop, murmur, click, rub or other extra sounds. Abdomen:  Bowel sounds positive,abdomen soft and non-tender without masses, organomegaly or hernias noted.   Impression & Recommendations:  Problem # 1:  NAUSEA AND VOMITING (ICD-787.01) Assessment Unchanged Patient sees Dr. Bosie Clos for her GERD.  Symptoms have been ongoing for 10 days.  Will check acute hepatitis panel and will refer to GI for further evaluation, may need EGD. Will also check CBC and f/u LFT's to monitor trend.     Orders: Venipuncture (84696) TLB-Hepatic/Liver Function Pnl (80076-HEPATIC) T-Hepatitis Acute Panel (29528-41324) TLB-CBC Platelet - w/Differential (85025-CBCD) Gastroenterology Referral (GI)  Problem # 2:  FATTY LIVER DISEASE (ICD-571.8) Assessment: New  Incidental note of fatty liver on [place].  Patient advised on weight loss, low cholesterol/low fat diet.  Transaminases are mildly elevated.  May be due to acute GI illness, but it is also possible that this is her baseline in setting of Fatty liver.  Unfortunately, we do not have any old LFT's available for evaluation.    Orders: Gastroenterology Referral (GI)  Complete Medication List: 1)  Levothroid 50 Mcg Tabs (Levothyroxine sodium) .Marland Kitchen.. 1 by mouth qd 2)  Protonix 40 Mg Tbec (Pantoprazole sodium) .... Qd 3)  Fluticasone Propionate 50 Mcg/act Susp (Fluticasone propionate) .... 2 sprays each nostril once daily 4)  Loratadine 10 Mg Tabs (Loratadine) .... Once daily as needed 5)  Ovcon-50 50-1 Mcg-mg Tabs (Norethindrone-eth estradiol) .Marland Kitchen.. 1 by mouth daily 6)  Zofran 4 Mg Tabs (Ondansetron hcl) .... One tablet by mouth every 8 hour as needed for nausea  Patient Instructions: 1)  Please call if symptoms worsen or do not improve. 2)  Follow up on Monday if your symptoms have not resolved.  Current Allergies (reviewed today): ! * TOPICAL HYDROCORTISONE

## 2010-09-13 NOTE — Assessment & Plan Note (Signed)
Summary: headache x3 days,nausea//lch   Vital Signs:  Patient profile:   40 year old female Weight:      168.50 pounds BMI:     30.20 Temp:     98.8 degrees F oral Pulse rate:   72 / minute Pulse rhythm:   regular Resp:     16 per minute BP sitting:   98 / 70  (left arm) Cuff size:   large  Vitals Entered By: Karen [name] CMA (October 13, 2009 11:35 AM) CC: room      Headache and nausea x 3 days. Comments Headache dulled by Advil. Nausea is persistent and has feeling of pressure in chest.   CC:  room      Headache and nausea x 3 days.Marland Kitchen  History of Present Illness: Karen [name] is a 40 year old female who presents today with c/o HA and fatigue which started on Sunday.  She developed nausea on Monday morning at 3AM.  Notes nausea has persisted.  + vomitting on Monday, no vomitting since.  Notes some abdominal cramping yesterday, soft stool this am but no diarrhea.  Notes some chest soreness, mild cough due to "gag reflex".  Notes that her HA was improved with advil.  Taking liquids, plus some solids.  Allergies: 1)  ! * Topical Hydrocortisone  Physical Exam  General:  Nauseous appearing white female, awake, alert, NAD Neck:  No deformities, masses, or tenderness noted. Lungs:  Normal respiratory effort, chest expands symmetrically. Lungs are clear to auscultation, no crackles or wheezes. Heart:  Normal rate and regular rhythm. S1 and S2 normal without gallop, murmur, click, rub or other extra sounds. Abdomen:  Bowel sounds positive,abdomen soft and non-tender without masses, organomegaly or hernias noted.   Impression & Recommendations:  Problem # 1:  GASTROENTERITIS, ACUTE (ICD-558.9) Assessment New Will plan to treat nausea with zofran, patient encouraged to drink more fluids- mild hypotension today suggestive of dehydration.  Will check BMET.  Pt instructed to call if symptoms worsen or do not improve.  Note given for work.   Her updated medication list for this problem  includes:    Zofran 4 Mg Tabs (Ondansetron hcl) ..... One tablet by mouth every 8 hour as needed for nausea  Orders: Venipuncture (32355) TLB-BMP (Basic Metabolic Panel-BMET) (80048-METABOL)  Complete Medication List: 1)  Levothroid 50 Mcg Tabs (Levothyroxine sodium) .Marland Kitchen.. 1 by mouth qd 2)  Protonix 40 Mg Tbec (Pantoprazole sodium) .... Qd 3)  Fluticasone Propionate 50 Mcg/act Susp (Fluticasone propionate) .... 2 sprays each nostril once daily 4)  Loratadine 10 Mg Tabs (Loratadine) .... Once daily as needed 5)  Ovcon-50 50-1 Mcg-mg Tabs (Norethindrone-eth estradiol) .Marland Kitchen.. 1 by mouth daily 6)  Zofran 4 Mg Tabs (Ondansetron hcl) .... One tablet by mouth every 8 hour as needed for nausea  Patient Instructions: 1)  Please call if worsening abdominal pain, inability to keep down fluids or food, or if fever over 101. 2)  Call if your symptoms are not resolved in 1 week. 3)  Drink frequent small amouts of gatorade/water or juice. Prescriptions: ZOFRAN 4 MG TABS (ONDANSETRON HCL) one tablet by mouth every 8 hour as needed for nausea  #20 x 0   Entered and Authorized by:   Karen Fillers FNP   Signed by:   Karen Fillers FNP on 10/13/2009   Method used:   Electronically to        [facility]. [address]. [phone]* (retail)  [address]       [place], [place]  [address]       Ph: [phone] or [phone]       Fax: [phone]   RxID:   [id number]   Current Allergies (reviewed today): ! * TOPICAL HYDROCORTISONE

## 2010-09-13 NOTE — Progress Notes (Signed)
  Phone Note Outgoing Call   Summary of Call: Left message for patient.  When she does call back pls let her know that her follow up LFT's are looking better than they did Monday.  I have also put in the referral for her to see Dr. Bosie Clos.  Acute hepatitis panel is still pending.  Initial call taken by: Lemont Fillers FNP,  October 20, 2009 4:49 PM  Follow-up for Phone Call        Pt called back, discussed labs.  She has apt with Dr. Bosie Clos tomorrow. Follow-up by: Lemont Fillers FNP,  October 20, 2009 5:14 PM

## 2010-09-13 NOTE — Progress Notes (Signed)
Summary: Fluticasone refill  Phone Note Refill Request Message from:  Fax from Pharmacy on July 20, 2010 3:26 PM  Refills Requested: Medication #1:  FLUTICASONE PROPIONATE 50 MCG/ACT  SUSP 2 sprays each nostril once daily   Last Refilled: 12/03/2009 [facility], [address], [place]      [phone]    [phone]   name on fax says "Fluticasone nasal sp (120 inh) 16gm" with directions "spray twice in each nostril daily"  Next Appointment Scheduled: none Initial call taken by: Jerolyn Shin,  July 20, 2010 3:32 PM    Prescriptions: FLUTICASONE PROPIONATE 50 MCG/ACT  SUSP (FLUTICASONE PROPIONATE) 2 sprays each nostril once daily  #1 x 3   Entered by:   Army Fossa CMA   Authorized by:   Nolon Rod. Paz MD   Signed by:   Army Fossa CMA on 07/21/2010   Method used:   Electronically to        [facility]. [address] [phone]* (retail)       [address]       [place], [place]  [address]       Ph: [phone] or [phone]       Fax: [phone]   RxID:   [id number]

## 2010-09-13 NOTE — Assessment & Plan Note (Signed)
Summary: bronchitis/kn   Vital Signs:  Patient profile:   40 year old female Weight:      171.50 pounds Temp:     99.5 degrees F oral Pulse rate:   100 / minute Pulse rhythm:   regular BP sitting:   130 / 80  (left arm) Cuff size:   regular  Vitals Entered By: Army Fossa CMA (March 28, 2010 11:23 AM) CC: Pt here for chest congestion Comments x 1 week. Using Qvar not helping much.   History of Present Illness: was seen here almost 3  weeks ago, status post a Z-Pak she improved but never felt 100% better Symptoms started to increase again a week ago   Dry cough, cough come in spells no wheezing Slightly short of breath when she tries to do some  physical activity  ROS No fever No nausea vomiting or diarrhea No GERD symptoms No lower extremity pain or swelling  Current Medications (verified): 1)  Levothroid 50 Mcg  Tabs (Levothyroxine Sodium) .Marland Kitchen.. 1 By Mouth Qd 2)  Protonix 40 Mg  Tbec (Pantoprazole Sodium) .... Qd 3)  Fluticasone Propionate 50 Mcg/act  Susp (Fluticasone Propionate) .... 2 Sprays Each Nostril Once Daily 4)  Loratadine 10 Mg Tabs (Loratadine) .... Once Daily As Needed 5)  Ovcon-50 50-1 Mcg-Mg Tabs (Norethindrone-Eth Estradiol) .Marland Kitchen.. 1 By Mouth Daily 6)  Qvar 40 Mcg/act Aers (Beclomethasone Dipropionate) .... 2 Puffs Two Times A Day  Allergies (verified): 1)  ! * Topical Hydrocortisone  Past History:  Past Medical History: Reviewed history from 03/09/2010 and no changes required. Hypothyroidism GERD Allergic rhinitis  Past Surgical History: Reviewed history from 08/06/2007 and no changes required. Thyroidectomy, partial Cholecystectomy  Social History: Reviewed history from 12/15/2008 and no changes required. Married 2 girls, 14.40 years old - twins   Physical Exam  General:  alert and well-developed.  frequent cough noted Ears:  R ear normal and L ear normal.   Nose:  note congested Mouth:  no redness or discharge Lungs:  normal  respiratory effort, no intercostal retractions, no accessory muscle use, and normal breath sounds.  no increased work of breathing Heart:  normal rate, regular rhythm, and no murmur.   Extremities:  calves  symmetric and nontender   Impression & Recommendations:  Problem # 1:  BRONCHITIS- ACUTE (ICD-466.0)  persistent cough at the time of the last visit, she was given a nebulizer and was prescribed  Qvar Today she denies wheezing and lung exam is clear. Atypical bronchitis? occult asthma  see instructions  The following medications were removed from the medication list:    Qvar 40 Mcg/act Aers (Beclomethasone dipropionate) .Marland Kitchen... 2 puffs two times a day Her updated medication list for this problem includes:    Tussionex Pennkinetic Er 8-10 Mg/65ml Lqcr (Chlorpheniramine-hydrocodone) .Marland Kitchen... 1 teaspoon twice a day as needed for cough    Doxycycline Hyclate 100 Mg Caps (Doxycycline hyclate) ..... One by mouth twice a day  Orders: T-2 View CXR (71020TC)  Problem # 2:  HYPOTHYROIDISM (ICD-244.9) f/u by gyn per patient  Her updated medication list for this problem includes:    Levothroid 50 Mcg Tabs (Levothyroxine sodium) .Marland Kitchen... 1 by mouth qd  Complete Medication List: 1)  Levothroid 50 Mcg Tabs (Levothyroxine sodium) .Marland Kitchen.. 1 by mouth qd 2)  Protonix 40 Mg Tbec (Pantoprazole sodium) .... Qd 3)  Fluticasone Propionate 50 Mcg/act Susp (Fluticasone propionate) .... 2 sprays each nostril once daily 4)  Loratadine 10 Mg Tabs (Loratadine) .... Once daily as needed  5)  Ovcon-50 50-1 Mcg-mg Tabs (Norethindrone-eth estradiol) .Marland Kitchen.. 1 by mouth daily 6)  Tussionex Pennkinetic Er 8-10 Mg/61ml Lqcr (Chlorpheniramine-hydrocodone) .Marland Kitchen.. 1 teaspoon twice a day as needed for cough 7)  Doxycycline Hyclate 100 Mg Caps (Doxycycline hyclate) .... One by mouth twice a day  Patient Instructions: 1)  stop Qvar 2)  Chest x-ray 3)  Doxycycline for one week, avoid excessive sun exposure 4)  Mucinex DM twice a day  for one week 5)  tussionex two times a day as needed for cough, will make you sleepy 6)  Call if not better in a few days Prescriptions: DOXYCYCLINE HYCLATE 100 MG CAPS (DOXYCYCLINE HYCLATE) one by mouth twice a day  #14 x 0   Entered and Authorized by:   Nolon Rod. Kendyl Festa MD   Signed by:   Nolon Rod. Ireland Virrueta MD on 03/28/2010   Method used:   Print then Give to Patient   RxID:   [id number] TUSSIONEX PENNKINETIC ER 8-10 MG/5ML LQCR (CHLORPHENIRAMINE-HYDROCODONE) 1 teaspoon twice a day as needed for cough  #250cc x 0   Entered and Authorized by:   Nolon Rod. Marelin Tat MD   Signed by:   Nolon Rod. Isabell Bonafede MD on 03/28/2010   Method used:   Print then Give to Patient   RxID:   [id number]

## 2010-09-13 NOTE — Letter (Signed)
Summary: diarrhea conservative Rx, increased IBS sx?---Gastroenterology  [facility]   Imported By: Lanelle Bal 05/25/2010 11:05:56  _____________________________________________________________________  External Attachment:    Type:   Image     Comment:   External Document

## 2010-09-13 NOTE — Assessment & Plan Note (Signed)
Summary: [facility] PT--N&V/DIARRHEA/NEEDS WORK NOTE/KB   Vital Signs:  Patient profile:   40 year old female Weight:      165.8 pounds Temp:     98.7 degrees F oral Pulse rate:   76 / minute Resp:     16 per minute BP sitting:   120 / 70  (left arm) Cuff size:   large  Vitals Entered By: Shonna Chock CMA (May 10, 2010 2:01 PM) CC: 1.) Nausea, vomitting (6-7 x today), and diarrhea  2.) Discuss when to take prozac, Diarrhea   Primary Care Provider:  Drue Novel  CC:  1.) Nausea, vomitting (6-7 x today), and diarrhea  2.) Discuss when to take prozac, and Diarrhea.  History of Present Illness: Diarrhea      This is a 40 year old woman who presents with Diarrhea since 05/06/2010.  The patient reports >6 stools per day, watery/unformed stools, and fecal urgency, but denies voluminous stools, blood in stool, mucus in stool, greasy stools, malodorous stools, fecal soiling, and abrupt onset of symptoms.  Associated symptoms include nausea and vomiting last Tues 09/20, after Prozac was started.  N&V restarted today, total of 6X. The patient denies fever, abdominal pain, abdominal cramps, lightheadedness, increased thirst, joint pains, mouth ulcers, and eye redness.  The symptoms are worse with any food.  The symptoms are better with hypomotility agents(Immodium AD).  Patient's risk factors for diarrhea include recent antibiotic use for bronchitis and sick contact Husband had diarrhea until 09/23.  Labs reviewed : TSH on 50  micrograms Levothroid; it was 1.44 on 08/22 @ Gyn.TSH was 0.19 after thyroid dose increased to  75 mcg. ENT saw her for thyroid nodules which he feels are not significant. LFTs were also mildly elevated.   Current Medications (verified): 1)  Levothroid 50 Mcg Tabs (Levothyroxine Sodium) .... 1.5 Tablets Daily 2)  Protonix 40 Mg  Tbec (Pantoprazole Sodium) .... Qd 3)  Fluticasone Propionate 50 Mcg/act  Susp (Fluticasone Propionate) .... 2 Sprays Each Nostril Once Daily 4)  Loratadine 10  Mg Tabs (Loratadine) .... Once Daily As Needed 5)  Ovcon-50 50-1 Mcg-Mg Tabs (Norethindrone-Eth Estradiol) .Marland Kitchen.. 1 By Mouth Daily 6)  Fluoxetine Hcl 20 Mg Tabs (Fluoxetine Hcl) .... Half Tablet Daily For One Week, Then One Tablet Daily  Allergies: 1)  ! * Topical Hydrocortisone  Physical Exam  General:  well-nourished,in no acute distress; alert,appropriate and cooperative throughout examination Eyes:  No corneal or conjunctival inflammation noted. No icterus; no lid lag Mouth:  Oral mucosa and oropharynx without lesions or exudates.  Teeth in good repair. No pharyngeal erythema.  Tongue moist Neck:  No deformities, masses, or tenderness noted.Thyroid slightly full w/o paplpable nodules Lungs:  Normal respiratory effort, chest expands symmetrically. Lungs are clear to auscultation, no crackles or wheezes. Heart:  Normal rate and regular rhythm. S1 and S2 normal without gallop, murmur, click, rub or other extra sounds. Abdomen:  Bowel sounds positive,abdomen soft and non-tender without masses, organomegaly or hernias noted. Extremities:  No onycholysis Neurologic:  alert & oriented X3.   No tremor Skin:  No jaundice; no tenting Cervical Nodes:  No lymphadenopathy noted Axillary Nodes:  No palpable lymphadenopathy   Impression & Recommendations:  Problem # 1:  DIARRHEA (ICD-787.91)  Orders: TLB-BMP (Basic Metabolic Panel-BMET) (80048-METABOL) TLB-CBC Platelet - w/Differential (85025-CBCD) T-Culture, C-Diff Toxin A/B (04540-98119) T-Culture, Stool (87045/87046-70140) T-Stool Giardia / Crypto- EIA (14782) T-Stool for O&P (95621-30865) Specimen Handling (99000)  Her updated medication list for this problem includes:  Lonox 2.5-0.025 Mg Tabs (Diphenoxylate-atropine) .Marland Kitchen... 1 as needed for watery diarrhea  Problem # 2:  VOMITING (ICD-787.03)  Orders: TLB-BMP (Basic Metabolic Panel-BMET) (80048-METABOL) TLB-CBC Platelet - w/Differential (85025-CBCD) Specimen Handling  (99000)  Problem # 3:  HYPOTHYROIDISM (ICD-244.9) TSH 0.19 on 75 micrograms  PMH of partial thyroidectomy Her updated medication list for this problem includes:    Levothroid 50 Mcg Tabs (Levothyroxine sodium) .Marland Kitchen... 1.5 tablets daily  Orders: Venipuncture (60454)  Problem # 4:  NONSPEC ELEVATION OF LEVELS OF TRANSAMINASE/LDH (ICD-790.4)  Orders: Venipuncture (09811) TLB-Hepatic/Liver Function Pnl (80076-HEPATIC)  Complete Medication List: 1)  Levothroid 50 Mcg Tabs (Levothyroxine sodium) .... 1.5 tablets daily 2)  Protonix 40 Mg Tbec (Pantoprazole sodium) .... Qd 3)  Fluticasone Propionate 50 Mcg/act Susp (Fluticasone propionate) .... 2 sprays each nostril once daily 4)  Loratadine 10 Mg Tabs (Loratadine) .... Once daily as needed 5)  Ovcon-50 50-1 Mcg-mg Tabs (Norethindrone-eth estradiol) .Marland Kitchen.. 1 by mouth daily 6)  Fluoxetine Hcl 20 Mg Tabs (Fluoxetine hcl) .... Half tablet daily for one week, then one tablet daily 7)  Promethegan 25 Mg Supp (Promethazine hcl) .Marland Kitchen.. 1 every 6 hrs as needed n&v 8)  Lonox 2.5-0.025 Mg Tabs (Diphenoxylate-atropine) .Marland Kitchen.. 1 as needed for watery diarrhea  Patient Instructions: 1)  Decrease thyroid to 50 micrograms once daily as TSH below goal of 1-3.Avoid excess Tylenol, alcohol & vitamin A. 2)  Drink clear liquids only for the next 24 hours, then slowly add other liquids and food as you  tolerate them. Prescriptions: LONOX 2.5-0.025 MG TABS (DIPHENOXYLATE-ATROPINE) 1 as needed for watery diarrhea  #10 x 0   Entered and Authorized by:   Marga Melnick MD   Signed by:   Marga Melnick MD on 05/10/2010   Method used:   Print then Give to Patient   RxID:   [id number] PROMETHEGAN 25 MG SUPP (PROMETHAZINE HCL) 1 every 6 hrs as needed N&V  #5 x 0   Entered and Authorized by:   Marga Melnick MD   Signed by:   Marga Melnick MD on 05/10/2010   Method used:   Print then Give to Patient   RxID:   [id number]

## 2010-09-13 NOTE — Assessment & Plan Note (Signed)
Summary: acute, fell on steps hurt tailbone/alr   Vital Signs:  Patient profile:   40 year old female Height:      62.75 inches Weight:      169 pounds Pulse rate:   59 / minute BP sitting:   120 / 72  Vitals Entered By: Kandice Hams (September 06, 2009 11:16 AM) CC: fell on steps c/o painful tailbone   CC:  fell on steps c/o painful tailbone.  History of Present Illness: Karen [name] is a 40 year old female who presents today s/p fall.  Notes that she fell coming down the stairs 1 week ago slipped down the stairs and landed on tail bone.  Patient notes ongoing soreness in the tailbone area.  Symptoms have improved somewhat with rest and is made worse by sitting. She took advil with minimal improvement.    Allergies: 1)  ! * Topical Hydrocortisone  Review of Systems       Denies issues of balance or fainting with fall, "just slipped",  + sacral pain, denies ecchymosis  Physical Exam  General:  Well-developed,well-nourished,in no acute distress; alert,appropriate and cooperative throughout examination Msk:  + tendereness to palpation overlying sacrum/gluteal cleft   Impression & Recommendations:  Problem # 1:  OTHER FALL (ICD-E888.8) Assessment New x-ray of coccyx sacrum negative for fx.  Plan conservative treatment, NSAIDS, Donut pillow as needed, Vicodin for severe pain, and time.  I supect + bruising.  If no improvement or if symptoms worsen can consider further imaging (i.e. CT or MRI) Orders: T-Coccyx/Sacrum 2 Views (72220TC)  Complete Medication List: 1)  Levothroid 50 Mcg Tabs (Levothyroxine sodium) .Marland Kitchen.. 1 by mouth qd 2)  Protonix 40 Mg Tbec (Pantoprazole sodium) .... Qd 3)  Fluticasone Propionate 50 Mcg/act Susp (Fluticasone propionate) .... 2 sprays each nostril once daily 4)  Loratadine 10 Mg Tabs (Loratadine) .... Once daily as needed 5)  Ovcon-50 50-1 Mcg-mg Tabs (Norethindrone-eth estradiol) .Marland Kitchen.. 1 by mouth daily 6)  Claritin-d 24 Hour 10-240 Mg Xr24h-tab  (Loratadine-pseudoephedrine) .... One tablet by mouth daily 7)  Vicodin 5-500 Mg Tabs (Hydrocodone-acetaminophen) .... One tablet by mouth every 6 hours as needed for severe pain  Patient Instructions: 1)  Please complete your x-ray.   2)  Your x-ray may be completed a the [place] office at [address] or at the [facility] in [place] at the corner of [address] and [address]. Prescriptions: VICODIN 5-500 MG TABS (HYDROCODONE-ACETAMINOPHEN) one tablet by mouth every 6 hours as needed for severe pain  #30 x 0   Entered and Authorized by:   Lemont Fillers FNP   Signed by:   Lemont Fillers FNP on 09/06/2009   Method used:   Print then Give to Patient   RxID:   [id number]

## 2010-09-13 NOTE — Letter (Signed)
Summary: Work Dietitian at [employer]  [address] [place], [place] [address]   Phone: [phone]  Fax: [phone]    Today's Date: June 15, 2010   Name of Patient: Karen [name]   The above named patient is currently under my care and cannot work from 06-15-10 through 06-20-10. She may return to work on 06-21-10.     Special Instructions:  [  ] None  [  ] To be off the remainder of today, returning to the normal work / school schedule tomorrow.  [ X ] To be off until the next scheduled appointment on 06-20-10  [  ] Other ________________________________________________________________ ________________________________________________________________________   Sincerely yours,   [name], MD

## 2010-09-13 NOTE — Letter (Signed)
Summary: Out of Work  [employer] [facility] [employer]  [address] [place], [place] [address]   Phone: [phone]  Fax: [phone]    June 20, 2010   Employee:  Karen [name]    To Whom It May Concern:   For Medical reasons, please excuse the above named employee from work for the following dates:  Start:   June 20, 2010  End:   June 28, 2010  If you need additional information, please feel free to contact our office.         Sincerely,    [name], MD

## 2010-09-13 NOTE — Letter (Signed)
Summary: Out of Work  [facility] [employer] [facility] [employer]  [address] [place], [place] [address]   Phone: [phone]  Fax: [phone]    October 18, 2009   Employee:  MARG MACMASTER    To Whom It May Concern:   For Medical reasons, please excuse the above named employee from work for the following dates:  Start:   10/18/09  End:   Has follow up appointment on 3/9 and we will determine return date at this visit.    If you need additional information, please feel free to contact our office.         Sincerely,    Lemont Fillers FNP

## 2010-09-13 NOTE — Progress Notes (Signed)
Summary: confirmation of med. leave date  Phone Note Outgoing Call   Summary of Call: Left message for pt. to call.  Need to know date pt. first missed work for the leave of absence paperwork we are completing for her job. Initial call taken by: Mervin Kung CMA,  October 21, 2009 9:52 AM  Follow-up for Phone Call        Spoke to pt. and confirmed first date out of work was 10/11/09. Follow-up by: Mervin Kung CMA,  October 21, 2009 11:41 AM

## 2010-09-13 NOTE — Consult Note (Signed)
Summary: [facility]  [facility]   Imported By: Lanelle Bal 12/14/2009 09:46:03  _____________________________________________________________________  External Attachment:    Type:   Image     Comment:   External Document

## 2010-12-19 ENCOUNTER — Ambulatory Visit
Admission: RE | Admit: 2010-12-19 | Discharge: 2010-12-19 | Disposition: A | Discharge status: Home / Self Care | Payer: BC Managed Care – PPO | Source: Ambulatory Visit | Attending: Endocrinology | Admitting: Endocrinology

## 2010-12-19 ENCOUNTER — Other Ambulatory Visit: Payer: Self-pay

## 2010-12-19 DIAGNOSIS — E041 Nontoxic single thyroid nodule: Secondary | ICD-10-CM

## 2010-12-30 NOTE — H&P (Signed)
Karen [name], Karen [name]             ACCOUNT NO.:  [id number]   MEDICAL RECORD NO.:  [id number]          PATIENT TYPE:  INP   LOCATION:  1825                         FACILITY:  [facility]   PHYSICIAN:  Shirley Friar, MDDATE OF BIRTH:  December 03, 1970   DATE OF ADMISSION:  03/23/2006  DATE OF DISCHARGE:                                HISTORY & PHYSICAL   REQUESTING PHYSICIAN:  Vanetta Mulders   REASON FOR ADMISSION:  Gallstone pancreatitis.   HISTORY OF PRESENT ILLNESS:  Karen [name] is a 40 year old white female who  reports 2 episodes of chest pressure, followed shortly after right upper  quadrant and epigastric pressure and pain.  These episodes are associated  with nausea and chills.  Her first episode occurred on March 20, 2006, and  led to a trip to the emergency room for evaluation.  She had ultrasound done  at that time which showed cholelithiasis without any cholecystitis and no  biliary dilatation with the common bile duct being measures at 6-mm.  There  was also fatty infiltration of liver seen.  At that time, she had a total  bilirubin of 2.2, AST 201, ALT 197 with a normal lipase and her alkaline  phosphatase was 112.  Due to resolution of her symptoms during her visit in  the emergency room, she was sent home on Percocet and Zofran and told to  follow up if her pain recurred.  Her pain recurred within 36 hours and she  had increased pressure in her abdomen and back and also severe right upper  quadrant tenderness.  She had a repeat ultrasound done this time which  showed multiple small gallstones in the gallbladder, common bile duct  dilation to 7-mm down to the level of the pancreatic head.  Her bilirubin  again has remained above 2.  AST 124, ALT 185, lipase 276.  She is again  having chills and nausea but denies any fevers or vomiting.  She reports a  similar episode back in April, which she says was thought to be heartburn  and she was given Protonix, which resolved the  pain up until this week.   PAST MEDICAL HISTORY:  1. History of reflux.  2. History irritable bowel syndrome.   MEDICINES:  Protonix, Carafate, Percocet, Zofran.   ALLERGIES:  NO KNOWN DRUG ALLERGIES.   FAMILY HISTORY:  Noncontributory.   SOCIAL HISTORY:  Denies cigarettes, alcohol, or drugs, married, 64 -48-  old twins.   REVIEW OF SYSTEMS:  Negative, except as stated above.   PHYSICAL EXAMINATION:  VITAL SIGNS:  Temperature is 97, pulse 92, blood  pressure 129/92, O2 sat is 98% on room air.  GENERAL:  Alert, no acute distress.  HEENT:  Nonicteric sclerae.  CHEST:  Clear to auscultation bilaterally.  CARDIOVASCULAR:  Regular rhythm.  ABDOMEN:  Right upper quadrant tenderness with guarding, mild epigastric  tenderness without guarding.  No rebound.  Soft, nondistended.  Positive  bowel sounds.   LABORATORY:  White blood count 8.0, hemoglobin 12.6, platelet count 242.  Potassium 3.3, BUN/creatinine 5 and 1.0.  Total bilirubin 2.1, ALP 108, AST  124, ALT  185, lipase 276.   IMPRESSION:  A 40 year old white female presenting with gallstone  pancreatitis without evidence of cholangitis.   She will be admitted to regular bed on bowel rest for gallstone pancreatitis  with aggressive IV fluid hydration.  The decision regarding timing of ERCP  versus surgery will be dependent on how her liver function test do over the  next 12-24 hours.  If her liver function tests completely normalize  overnight, then most likely we will be able to avoid ERCP and would defer to  surgery timing of cholecystectomy.  However if her liver function tests  remain elevated, will likely need ERCP this weekend.  I discussed risks,  benefits, alternatives of ERCP.  We will await her LFTs in the morning and  then make a decision on her next course of action.      Shirley Friar, MD  Electronically Signed     VCS/MEDQ  D:  03/23/2006  T:  03/23/2006  Job:  [id number]   cc:   Sandria Bales. Ezzard Standing,  M.D.

## 2010-12-30 NOTE — Op Note (Signed)
Karen [name], ROSZAK             ACCOUNT NO.:  [id number]   MEDICAL RECORD NO.:  [id number]          PATIENT TYPE:  INP   LOCATION:  3039                         [name]   PHYSICIAN:  Karen [name], M.D.  DATE OF BIRTH:  01-30-1971   DATE OF PROCEDURE:  04/25/2006  DATE OF DISCHARGE:                                 OPERATIVE REPORT   PREOPERATIVE DIAGNOSIS:  Cholelithiasis, cholecystitis.   POSTOPERATIVE DIAGNOSIS:  Cholelithiasis, mild cholecystitis, probable  common bile duct stone.   PROCEDURE:  Laparoscopic cholecystectomy with intraoperative cholangiogram  and common bile duct aspiration with #4 Fogarty catheter.   SURGEON:  Karen [name], M.D.   FIRST ASSISTANT:  Karen [name], M.D.   ANESTHESIA:  General endotracheal.   ESTIMATED BLOOD LOSS:  Minimal.   INDICATION FOR PROCEDURE:  Karen [name] is a 40 year old white female of Dr.  Willow [name] and Karen [name] who has had recurrent bouts of abdominal pain  has presented with mild pancreatitis, elevated liver functions to the Karen [name], admitted by Karen [name].  I carried out a discussion  with him and we think she is probably best served by having her  cholecystectomy first; if her cholangiogram shows common bile duct stones,  letting him do an ERCP postop than trying to do one preop.   I discussed the indication and potential complications of the procedure with  the patient and her husband.  Potential complications include but are not  limited to bleeding, infection, common bile duct injury, the need for open  surgery, and the possibility that she may need ERCP postop.   OPERATIVE NOTE:  Patient was given 1 gm of Ancef at initiation of procedure.  PAS stocking placed.  Her abdomen was prepped with Betadine solution and  sterilely draped.  She was placed under general anesthesia.  She is followed  by Dr. .   I went through an infraumbilical incision with sharp dissection carried  down  to the abdominal cavity.  A 0-degree, 10-mm laparoscope was inserted through  a 12-mm Hasson trocar and the Hasson trocar was secured with a 0 Vicryl  suture.  Three additional trocars were placed, a 10-mm subxiphoid trocar, a  5-mm right mesocostal trocar, and a 5-mm lateral subcostal trocar.  Abdominal exploration was carried out.  The liver was noted to be fatty but  not acutely inflamed.  The bowel, that what could be seen, was unremarkable,  and her stomach was unremarkable.   The gallbladder actually had a fairly normal appearance to it.  It was  grasped with two grasping clamps by Karen [name] and rotated cephalad.   I then started dissection along the cystic duct-gallbladder junction.  I  placed a clip on the gallbladder side of the cystic duct, identified what  looks like an anterior branch and the posterior branch of the cystic artery  and doubly endoclipped these and divided these.   I then shot an intraoperative cholangiogram.  The intraoperative  cholangiogram was shot with a cutoff Taut catheter inserted through a 14-  gauge Gelco in the right upper  quadrant and the Taut catheter was secured to  the cut cystic duct with an Endoclip.   An initial cholangiogram was shot with about 6-7 mL of half-strength Hypaque  solution with fluoroscopy, and it showed non emptying of the common bile  duct with what looked like a possible meniscal side at the distal common  bile duct.   The patient was then given 1 mg of Glucagon and I did an exploration of the  common bile duct with a #4 Fogarty catheter.  I was able to pass the Fogarty  catheter twice up to 30 cm and I thought I got well through the ampulla of  Karen [name] into the duodenum.  I was able to blow the catheter up to about 0.25  cm and pull it through the ampulla twice.   I then re-shot another cholangiogram, again with about 6-7 cm of half-  strength Hypaque solution through a Taut catheter which was secured with an   Endoclip and this time it showed free flow of contrast down the cystic bile  duct into the common bile duct, up to hepatic radicals and into the  duodenum.  There was no resistance of flow and there was no obvious filling  defect.   The patient was felt to have successfully cleared the common bile duct  either with a stone or with spasm.  The cystic duct was then triply  endoclipped and divided. The gallbladder was sharply and bluntly resected  from the gallbladder bed.  Prior to complete division of the gallbladder  from the gallbladder bed, I re-visualized the triangle of Calot.  I re-  visualized the gallbladder bed, saw no bile leak or bleeding, put the  gallbladder in an EndoCatch bag and delivered it through the umbilicus.  I  closed the umbilical incision with a 0 Vicryl suture.  The skin on each side  was closed with a 5-0 Vicryl suture.  I used about 20 mL of 0.25% Marcaine  as a local anesthetic, painted each wound with tincture of Benzoin, and  Steri-Strips.   The patient tolerated the procedure well.  She will be kept here.  She can  actually probably go home later this afternoon or tomorrow, depending on how  well she is doing, and will follow up with me in 2-3 weeks.      Karen [name], M.D.  Electronically Signed     DHN/MEDQ  D:  03/25/2006  T:  03/25/2006  Job:  [id number]   cc:   Karen Friar, MD  Karen Plump, MD Karen [name]  Karen [name], M.D.

## 2010-12-30 NOTE — Discharge Summary (Signed)
Karen [name], Karen [name]             ACCOUNT NO.:  [id number]   MEDICAL RECORD NO.:  [id number]          PATIENT TYPE:  INP   LOCATION:  9156                          FACILITY:  [facility]   PHYSICIAN:  Guy Sandifer. Henderson Cloud, M.D. DATE OF BIRTH:  03-Mar-1971   DATE OF ADMISSION:  04/05/2004  DATE OF DISCHARGE:  04/08/2004                                 DISCHARGE SUMMARY   ADMITTING DIAGNOSES:  1.  Intrauterine pregnancy at 72 and five-sevenths weeks.  2.  Twin gestation.  3.  Preterm labor.   DISCHARGE DIAGNOSES:  1.  Intrauterine pregnancy at 30 and two-sevenths weeks.  2.  Twin gestation.  3.  Preterm labor, tocolyzed.   REASON FOR ADMISSION:  Please see written H&P.   HOSPITAL COURSE:  The patient was a 40 year old gravida 2 para 0 that was  admitted to [facility] with a twin gestation at 75 and six-  sevenths weeks with preterm labor.  The patient had been on oral  terbutaline; however, she presented to the office today with increasing  contractions.  Cervix was noted to be 1 cm dilated, 50% effaced, at a -2  station.  The patient had history of hypothyroidism with history of partial  thyroidectomy, currently on Synthroid.  On admission, vital signs were  stable.  Fetal heart tones on twin A and B were both reactive.  The patient  was administered magnesium sulfate for tocolysis.  She was also administered  betamethasone for enhancement of lung maturity.  The patient continued on  magnesium sulfate for 24 hours.  She was subsequently weaned off the  magnesium sulfate and a trial of oral Procardia was given.  On the morning  of August 25, the patient was having a rare contraction.  Vital signs were  stable, she remained afebrile.  Fetal heart tones A and B were in the 140s.  She seemed to be tolerating the p.o. Procardia well with rare contractions.  On the morning of August 26, the patient was feeling well.  Vital signs were  stable, she was afebrile, she was having a  rare contraction, fetal heart  tones were reactive.  [facility] had been notified and was able to  obtain home uterine monitoring, and the patient was discharged home.   CONDITION ON DISCHARGE:  Stable.   DIET:  Regular as tolerated.   ACTIVITY:  Modified bedrest.   FOLLOW-UP:  The patient is to follow up in the office in 1 week.  She is to  call for increasing contractions, spontaneous rupture of membranes, or  decrease in fetal movement.   DISCHARGE MEDICATIONS:  1.  Protonix 40 mg one p.o. daily.  2.  Procardia 10 mg q.6h.  3.  Multivitamin daily.  4.  Synthroid 50 mcg daily.      CC/MEDQ  D:  05/04/2004  T:  05/04/2004  Job:  [id number]

## 2010-12-30 NOTE — Discharge Summary (Signed)
Karen [name], Karen [name]             ACCOUNT NO.:  [id number]   MEDICAL RECORD NO.:  [id number]          PATIENT TYPE:  INP   LOCATION:  3039                         FACILITY:  [facility]   PHYSICIAN:  Shirley Friar, MDDATE OF BIRTH:  1970/12/05   DATE OF ADMISSION:  03/23/2006  DATE OF DISCHARGE:  03/25/2006                                 DISCHARGE SUMMARY   DISCHARGE DIAGNOSES:  1. Gallstone pancreatitis.  2. Cholelithiasis.  3. Choledocholithiasis.   DISCHARGE MEDICATIONS:  1. Vicodin 1-2 tablets p.o. q.6 h. p.r.n.  2. Protonix 40 mg p.o. daily.  3. Carafate p.r.n.  4. Zofran 8 mg p.o. q.4-6 h. p.r.n.  5. Levothyroxine 0.05 mg p.o. daily.   FOLLOW UP:  1. Follow up with Dr. Ovidio Kin in 2-3 weeks.  2. Follow up with Dr. Charlott Rakes in 3-4 weeks.  3. The patient should follow with her primary care physician in September      of 2007.   DIET:  Low-fat diet.   WORK RESTRICTIONS:  The patient should stay out of work for a week, and a  note has been given regarding that.   HOSPITAL COURSE:  The patient was admitted on March 23, 2006 due to right  upper quadrant pain, elevated liver function tests and a lipase of 276.  Her  bilirubin was 2.1, AST of 124, ALT of 185 and ALP 108.  Ultrasound showed  CBD at 7 mm down to the level of the pancreatic head and multiple gallstones  in the gallbladder.  There was no evidence of cholecystitis on ultrasound.  The patient had rapid decrease in her lipase on the following day with a  lipase of 28, bilirubin of 1, ALP of 93, AST of 114, and ALT of 169.  Her  pain resolved within 24 hours of admission and based on this marked  improvement, it was felt that a laparoscopic cholecystectomy along with  intraoperative cholangiogram was the next best step instead of preoperative  ERCP.  She underwent a laparoscopic cholecystectomy on March 25, 2006 and  the cholangiogram showed a lack of filling of the small intestine with the  cholangiogram suspicious for a common bile duct stone.  Catheters were used  intraoperatively to open up the duct, which allowed passage of contrast into  the small bowel.  The patient also had a successful laparoscopic  cholecystectomy and did well postoperatively.   Dr. Ezzard Standing felt the patient could go home today if she remained stable  postprocedure, which she did.  She tolerated a regular diet, but has normal  postoperative type pain.  The patient is stable for discharge home with  follow up as stated above.  The patient will be on a low-fat diet as  recommended by surgery.      Shirley Friar, MD  Electronically Signed     VCS/MEDQ  D:  03/25/2006  T:  03/26/2006  Job:  [id number]   cc:   Sandria Bales. Ezzard Standing, M.D.

## 2010-12-30 NOTE — Consult Note (Signed)
[name], [name]             ACCOUNT NO.:  [id number]   MEDICAL RECORD NO.:  [id number]          PATIENT TYPE:  INP   LOCATION:  3039                         FACILITY:  [facility]   PHYSICIAN:  Karen [name], M.D.  DATE OF BIRTH:  [birth year]   DATE OF CONSULTATION:  03/24/2006  DATE OF DISCHARGE:                                   CONSULTATION   HISTORY OF PRESENT ILLNESS:  Karen [name] is a 40 year old white female who  sees Karen [name] as her primary medical doctor, Karen [name] is her  gynecologist. She presents with episodes of abdominal pain. She dates these  symtoms back to April of 2007 when she had what she describes as chest pain,  nausea and vomiting. She was diagnosed at that time with gastroesophageal  reflux disease and was given some Protonix and Carafate for treatment. She  has twins who are about 65 months old.   She had no further symptoms like this until this past Tuesday, the 7th of  August when she developed tightness in her chest, epigastric pain and  nausea. She was seen by Karen [name] who did an EKG to rule out cardiac disease,  etc., for an ultrasound which showed gallstones. On Wednesday she had  another attack, this was the 8th of August. She went to the Karen [name]  emergency room, it got better, was sent home then returned with another  attack. She was nauseated all day Friday on the 10th, and this time she was  admitted by Karen [name].   She is noted to have some mildly elevated liver enzymes with an SGOT of 114  and SGPT of 169, her lipase was up to 276 and bilirubin at 2.2. She had an  ultrasound again which showed multiple small gallstones. There is a question  as to whether she needed ERCP first, or going ahead with a cholecystectomy.   However, this morning her systems have completely resolved.  Her liver  functions have returned to normal.   Again, she has a history of gastroesophageal reflux disease and I wonder  whether this was symptoms of biliary  disease she was having and passed. She  has no history of liver disease, prior pancreatic disease or colon disease,  and both children were born vaginally. She has a very strong family history  of gallbladder disease.   PAST MEDICAL HISTORY:  SHE HAS NO KNOWN ALLERGIES.   MEDICATIONS ON ADMISSION:  1. Protonix 40 mg daily.  2. Carafate 1 gram p.o. x4 daily p.r.n.  3. Percocet.  4. Zofran for nausea.  5. Levothyroxine. 0.05 mg daily.  History of prior partial thyroidectomy.   REVIEW OF SYSTEMS:  NEUROLOGIC:  No seizures or loss of consciousness.  PULMONARY:  She does not smoke cigarettes, no pneumonia, tuberculosis.  CARDIAC:  She has had this chest pain every time that in retrospect may have  been some biliary colic and she has had repeated EKGs which have been  negative. She has no history of hypertension or cardiac catheterization.  GASTROINTESTINAL:  See history of present illness.  UROLOGIC:  No kidney  stones or kidney infections.  GYN:  Again, she has twins who are 9-months-old.  Her last menstrual period  was July 15th.   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 Her husband  was in the room when I examined her.   PHYSICAL EXAMINATION:  VITAL SIGNS:  Blood pressure is 105/72, heart rate is  77, temperature 98.7.  GENERAL:  She is a well-nourished, mildly overweight white female, alert and  cooperative with physical exam.  HEENT:  Unremarkable.  NECK:  Supple without mass, without thyromegaly.  LUNGS:  Clear to auscultation with symmetric breath sounds.  HEART:  Regular rate and rhythm without murmur or rub.  ABDOMEN:  Soft though somewhat doughy, no organomegaly, no hernia, no mass,  no guarding.  RECTAL:  Deferred.  EXTREMITIES:  She has good strength in all four extremities.  NEUROLOGIC:  Grossly intact.   LABS:  Her labs that I have show a white blood count of 6800, hemoglobin  12.9, hematocrit 37.5. Her sodium is 144, potassium 3.7, BUN of 4,   creatinine 1.2, glucose of 99.  Her AST is 114, ALT is 169, alkaline  phosphatase 93, total bilirubin 1.0, amylase is 63, lipase of 28. Hepatitis  screen: Hepatitis B and A are negative.   Her ultrasound showed multiple small gallstones with common bile duct  measuring 7 mm in size.   DISCHARGE DIAGNOSIS:  #1. Cholelithiasis with possible chronic cholecystitis  and mild pancreatitis probably secondary to her gallstones. This is now  resolved.  I discussed with the patient about the options of ERCP first versus  gallbladder surgery. I think going ahead with her cholecystectomy first  makes the most sense.  I discussed with her and her husband the indications  and potential complications of gallbladder surgery, potential complications  include, but are not limited to, bleeding, infection, common bile duct  injury, the possibility of open surgery and the need for ERCP  postoperatively if she does have common bile duct stones and she understands  it pretty well.   #2. Gastroesophageal reflux disease. Some of this actually may be of biliary  disease.   #3. Irritable bowel syndrome.   #4. Thyroid replacement.   The patient understands. She is tentatively on the OR book for tomorrow but  she is a weekend schedule and if something becomes more critical she could  be  bumped or delayed for a day or two.      Karen [name], M.D.  Electronically Signed     DHN/MEDQ  D:  03/24/2006  T:  03/24/2006  Job:  [id number]   cc:   Karen Plump, MD [facility]  Karen [name], M.D.  Karen Friar, MD

## 2011-01-19 ENCOUNTER — Other Ambulatory Visit: Payer: Self-pay | Admitting: Endocrinology

## 2011-01-19 DIAGNOSIS — E041 Nontoxic single thyroid nodule: Secondary | ICD-10-CM

## 2011-01-31 ENCOUNTER — Other Ambulatory Visit: Payer: Self-pay | Admitting: Interventional Radiology

## 2011-01-31 ENCOUNTER — Ambulatory Visit
Admission: RE | Admit: 2011-01-31 | Discharge: 2011-01-31 | Disposition: A | Discharge status: Home / Self Care | Payer: BC Managed Care – PPO | Source: Ambulatory Visit | Attending: Endocrinology | Admitting: Endocrinology

## 2011-01-31 ENCOUNTER — Other Ambulatory Visit (HOSPITAL_COMMUNITY)
Admission: RE | Admit: 2011-01-31 | Discharge: 2011-01-31 | Disposition: A | Discharge status: Home / Self Care | Payer: BC Managed Care – PPO | Source: Ambulatory Visit | Attending: Interventional Radiology | Admitting: Interventional Radiology

## 2011-01-31 DIAGNOSIS — E041 Nontoxic single thyroid nodule: Secondary | ICD-10-CM

## 2011-01-31 DIAGNOSIS — E049 Nontoxic goiter, unspecified: Secondary | ICD-10-CM | POA: Insufficient documentation

## 2011-12-19 ENCOUNTER — Other Ambulatory Visit: Payer: Self-pay | Admitting: Endocrinology

## 2011-12-19 DIAGNOSIS — E049 Nontoxic goiter, unspecified: Secondary | ICD-10-CM

## 2012-01-02 ENCOUNTER — Ambulatory Visit
Admission: RE | Admit: 2012-01-02 | Discharge: 2012-01-02 | Disposition: A | Discharge status: Home / Self Care | Payer: BC Managed Care – PPO | Source: Ambulatory Visit | Attending: Endocrinology | Admitting: Endocrinology

## 2012-01-02 ENCOUNTER — Encounter (HOSPITAL_BASED_OUTPATIENT_CLINIC_OR_DEPARTMENT_OTHER): Payer: Self-pay | Admitting: Emergency Medicine

## 2012-01-02 ENCOUNTER — Emergency Department (HOSPITAL_BASED_OUTPATIENT_CLINIC_OR_DEPARTMENT_OTHER)
Admission: EM | Admit: 2012-01-02 | Discharge: 2012-01-02 | Disposition: A | Discharge status: Home / Self Care | Payer: BC Managed Care – PPO | Attending: Emergency Medicine | Admitting: Emergency Medicine

## 2012-01-02 DIAGNOSIS — R5381 Other malaise: Secondary | ICD-10-CM | POA: Insufficient documentation

## 2012-01-02 DIAGNOSIS — R42 Dizziness and giddiness: Secondary | ICD-10-CM | POA: Insufficient documentation

## 2012-01-02 DIAGNOSIS — R000 Tachycardia, unspecified: Secondary | ICD-10-CM | POA: Insufficient documentation

## 2012-01-02 DIAGNOSIS — E049 Nontoxic goiter, unspecified: Secondary | ICD-10-CM

## 2012-01-02 DIAGNOSIS — R109 Unspecified abdominal pain: Secondary | ICD-10-CM | POA: Insufficient documentation

## 2012-01-02 DIAGNOSIS — H538 Other visual disturbances: Secondary | ICD-10-CM | POA: Insufficient documentation

## 2012-01-02 HISTORY — DX: Gastro-esophageal reflux disease without esophagitis: K21.9

## 2012-01-02 HISTORY — DX: Other seasonal allergic rhinitis: J30.2

## 2012-01-02 HISTORY — DX: Nontoxic single thyroid nodule: E04.1

## 2012-01-02 LAB — CBC
MCH: 25 pg — ABNORMAL LOW (ref 26.0–34.0)
MCHC: 33.3 g/dL (ref 30.0–36.0)
MCV: 75.1 fL — ABNORMAL LOW (ref 78.0–100.0)
Platelets: 348 10*3/uL (ref 150–400)

## 2012-01-02 LAB — DIFFERENTIAL
Basophils Relative: 0 % (ref 0–1)
Eosinophils Absolute: 0.1 10*3/uL (ref 0.0–0.7)
Eosinophils Relative: 1 % (ref 0–5)
Lymphs Abs: 3.1 10*3/uL (ref 0.7–4.0)
Neutrophils Relative %: 66 % (ref 43–77)

## 2012-01-02 LAB — COMPREHENSIVE METABOLIC PANEL
ALT: 19 U/L (ref 0–35)
Albumin: 4.3 g/dL (ref 3.5–5.2)
Alkaline Phosphatase: 91 U/L (ref 39–117)
BUN: 9 mg/dL (ref 6–23)
Calcium: 9.7 mg/dL (ref 8.4–10.5)
GFR calc Af Amer: 72 mL/min — ABNORMAL LOW (ref >90)
Glucose, Bld: 108 mg/dL — ABNORMAL HIGH (ref 70–99)
Potassium: 3.6 mEq/L (ref 3.5–5.1)
Sodium: 140 mEq/L (ref 135–145)
Total Protein: 8.7 g/dL — ABNORMAL HIGH (ref 6.0–8.3)

## 2012-01-02 LAB — URINALYSIS, ROUTINE W REFLEX MICROSCOPIC
Glucose, UA: NEGATIVE mg/dL
Leukocytes, UA: NEGATIVE
Nitrite: NEGATIVE
Specific Gravity, Urine: 1.006 (ref 1.005–1.030)
pH: 6 (ref 5.0–8.0)

## 2012-01-02 LAB — PREGNANCY, URINE: Preg Test, Ur: NEGATIVE

## 2012-01-02 LAB — URINE MICROSCOPIC-ADD ON

## 2012-01-02 LAB — GLUCOSE, CAPILLARY: Glucose-Capillary: 91 mg/dL (ref 70–99)

## 2012-01-02 MED ORDER — SODIUM CHLORIDE 0.9 % IV BOLUS (SEPSIS)
1000.0000 mL | Freq: Once | INTRAVENOUS | Status: AC
  Administered 2012-01-02: 1000 mL via INTRAVENOUS

## 2012-01-02 NOTE — ED Notes (Signed)
Pt states she went to have ultrasound of her thyroid this am.  When she left the building around 0830, she noticed she had blurry vision, some generalized weakness and dizziness.  No pain, no nausea, no numbness or tingling.

## 2012-01-02 NOTE — ED Provider Notes (Signed)
History     CSN: [id number]  Arrival date & time 01/02/12  4098   First MD Initiated Contact with Patient 01/02/12 1000      Chief Complaint  Patient presents with  . Blurred Vision  . Dizziness  . Weakness    (Consider location/radiation/quality/duration/timing/severity/associated sxs/prior treatment) HPI Pt was having a "vigorous" [place] of her thyroid this morning and when leaving building around 0830 began having blurry vision, lightheadedness and generalized weakness. No focal weakness, N/V, fever, chest pain, SOB, cough, urinary symptoms, lower ext swelling. Pt states she feels better now though still has mild blurry vision. Pt states she did not eat breakfast Past Medical History  Diagnosis Date  . Thyroid nodule   . Seasonal allergies   . GERD (gastroesophageal reflux disease)     Past Surgical History  Procedure Date  . Thyroidectomy, partial     History reviewed. No pertinent family history.  History  Substance Use Topics  . Smoking status: Not on file  . Smokeless tobacco: Not on file  . Alcohol Use:     OB History    Grav Para Term Preterm Abortions TAB SAB Ect Mult Living                  Review of Systems  Constitutional: Positive for fatigue. Negative for fever and chills.  HENT: Negative for neck pain and neck stiffness.   Eyes: Positive for visual disturbance. Negative for pain.  Respiratory: Negative for chest tightness, shortness of breath and wheezing.   Cardiovascular: Negative for chest pain, palpitations and leg swelling.  Gastrointestinal: Positive for abdominal pain. Negative for nausea and vomiting.  Genitourinary: Negative for dysuria.  Musculoskeletal: Negative for back pain.  Skin: Negative for pallor and rash.  Neurological: Positive for light-headedness. Negative for tremors, syncope, weakness, numbness and headaches.    Allergies  Hydrocortisone  Home Medications   Current Outpatient Rx  Name Route Sig Dispense Refill  .  LEVOTHYROXINE SODIUM 25 MCG PO TABS Oral Take 25 mcg by mouth daily.    Marland Kitchen LORATADINE-PSEUDOEPHEDRINE ER 10-240 MG PO TB24 Oral Take 1 tablet by mouth daily.    Marland Kitchen NORGESTREL-ETHINYL ESTRADIOL 0.3-30 MG-MCG PO TABS Oral Take 1 tablet by mouth daily.    Marland Kitchen PANTOPRAZOLE SODIUM 20 MG PO TBEC Oral Take 20 mg by mouth daily.      BP 139/81  Pulse 99  Temp(Src) 97.8 F (36.6 C) (Oral)  Resp 16  Ht 5\' 2"  (1.575 m)  Wt 175 lb (79.379 kg)  BMI 32.01 kg/m2  SpO2 100%  LMP 12/24/2011  Physical Exam  Nursing note and vitals reviewed. Constitutional: She is oriented to person, place, and time. She appears well-developed and well-nourished. No distress.  HENT:  Head: Normocephalic and atraumatic.  Mouth/Throat: Oropharynx is clear and moist.  Eyes: EOM are normal. Pupils are equal, round, and reactive to light.  Neck: Normal range of motion. Neck supple. No thyromegaly present.  Cardiovascular: Regular rhythm.        Mild tachycardia  Pulmonary/Chest: Effort normal and breath sounds normal. No stridor. No respiratory distress. She has no wheezes. She has no rales.  Abdominal: Soft. Bowel sounds are normal. She exhibits no mass. There is no tenderness. There is no rebound and no guarding.  Musculoskeletal: Normal range of motion. She exhibits no edema and no tenderness.       No calf tenderness or swelling  Neurological: She is alert and oriented to person, place, and time. No cranial  nerve deficit. Coordination normal.       5/5 motor in all ext, sensation intact, finger to nose intact bl.   Skin: Skin is warm and dry. No rash noted. No erythema.  Psychiatric: She has a normal mood and affect. Her behavior is normal.    ED Course  Procedures (including critical care time)  Labs Reviewed  CBC - Abnormal; Notable for the following:    WBC 11.4 (*)    RBC 5.47 (*)    MCV 75.1 (*)    MCH 25.0 (*)    RDW 15.7 (*)    All other components within normal limits  COMPREHENSIVE METABOLIC PANEL -  Abnormal; Notable for the following:    Glucose, Bld 108 (*)    Total Protein 8.7 (*)    GFR calc non Af Amer 62 (*)    GFR calc Af Amer 72 (*)    All other components within normal limits  URINALYSIS, ROUTINE W REFLEX MICROSCOPIC - Abnormal; Notable for the following:    Hgb urine dipstick SMALL (*)    All other components within normal limits  URINE MICROSCOPIC-ADD ON - Abnormal; Notable for the following:    Squamous Epithelial / LPF MANY (*)    All other components within normal limits  DIFFERENTIAL  PREGNANCY, URINE  GLUCOSE, CAPILLARY   US Soft Tissue Head/neck  01/02/2012  *RADIOLOGY REPORT*  Clinical Data: History of thyroid goiter and right thyroid nodule. Prior left thyroidectomy.  THYROID ULTRASOUND  Technique: Ultrasound examination of the thyroid gland and adjacent soft tissues was performed.  Comparison:  None.  Findings:  Right thyroid lobe:  Stable dimensions of 5.8 x 1.8 x 2.2 cm. Left thyroid lobe:  Surgically absent. Isthmus:  0.2 cm  Focal nodules:  The dominant nodule in the inferior right lobe shows stable size and morphology since the prior study with partial areas of internal cystic degeneration.  The nodule measures approximately 1.0 x 1.9 x 1.3 cm.  Additional subtle area of adjacent nodularity was measured today and previously not measured. When reviewing prior imaging, this area was present previously and likely is a pseudonodule based on appearance.  This region measures roughly 1 cm.  Small cyst in the mid right lobe measures 0.4 cm and is benign in appearance.  Lymphadenopathy:  None visualized.  IMPRESSION: Stable appearance of the right lobe of the thyroid gland with stable size and morphology of the dominant solid and cystic nodule.  Original Report Authenticated By: Reola Calkins, M.D.     1. Blurred vision      Date: 01/02/2012  Rate: 94  Rhythm: normal sinus rhythm  QRS Axis: normal  Intervals: normal  ST/T Wave abnormalities: normal  Conduction  Disutrbances:none  Narrative Interpretation:   Old EKG Reviewed: unchanged     MDM  Pt now is symptom-free. Vision has returned to normal. HR has improved with IVF's. Will have f/u with PMD. Think likilhood of PE or TIA remote. Discussed with pt that if symptoms return she will need to be evaluated immediately. Pt voiced understanding and agrees with plan        Loren Racer, MD 01/02/12 [phone]

## 2012-01-02 NOTE — Discharge Instructions (Signed)
Blurred Vision You have been seen today complaining of blurred vision. This means you have a loss of ability to see small details.  CAUSES  Blurred vision can be a symptom of underlying eye problems, such as:  Aging of the eye (presbyopia).   Glaucoma.   Cataracts.   Eye infection.   Eye-related migraine.   Diabetes mellitus.   Fatigue.   Migraine headaches.   High blood pressure.   Breakdown of the back of the eye (macular degeneration).   Problems caused by some medications.  The most common cause of blurred vision is the need for eyeglasses or a new prescription. Today in the emergency department, no cause for your blurred vision can be found. SYMPTOMS  Blurred vision is the loss of visual sharpness and detail (acuity). DIAGNOSIS  Should blurred vision continue, you should see your caregiver. If your caregiver is your primary care physician, he or she may choose to refer you to another specialist.  TREATMENT  Do not ignore your blurred vision. Make sure to have it checked out to see if further treatment or referral is necessary. SEEK MEDICAL CARE IF:  You are unable to get into a specialist so we can help you with a referral. SEEK IMMEDIATE MEDICAL CARE IF: You have severe eye pain, severe headache, or sudden loss of vision. MAKE SURE YOU:   Understand these instructions.   Will watch your condition.   Will get help right away if you are not doing well or get worse.  Document Released: 08/03/2003 Document Revised: 07/20/2011 Document Reviewed: 03/04/2008 Exit[employer] Patient Information 2012 [employer]. 

## 2012-08-31 ENCOUNTER — Encounter (HOSPITAL_COMMUNITY): Payer: Self-pay | Admitting: Pharmacy Technician

## 2012-09-03 ENCOUNTER — Encounter (HOSPITAL_COMMUNITY)
Admission: RE | Admit: 2012-09-03 | Discharge: 2012-09-03 | Disposition: A | Discharge status: Home / Self Care | Payer: BC Managed Care – PPO | Source: Ambulatory Visit | Attending: Obstetrics and Gynecology | Admitting: Obstetrics and Gynecology

## 2012-09-03 ENCOUNTER — Encounter (HOSPITAL_COMMUNITY): Payer: Self-pay

## 2012-09-03 HISTORY — DX: Other specified postprocedural states: R11.2

## 2012-09-03 HISTORY — DX: Other specified postprocedural states: Z98.890

## 2012-09-03 HISTORY — DX: Hypothyroidism, unspecified: E03.9

## 2012-09-03 LAB — CBC
HCT: 38.8 % (ref 36.0–46.0)
Hemoglobin: 12.6 g/dL (ref 12.0–15.0)
MCH: 25.4 pg — ABNORMAL LOW (ref 26.0–34.0)
MCHC: 32.5 g/dL (ref 30.0–36.0)
MCV: 78.1 fL (ref 78.0–100.0)
RDW: 14.6 % (ref 11.5–15.5)

## 2012-09-03 NOTE — Patient Instructions (Addendum)
   Your procedure is scheduled on:  Thursday, Jan 30  Enter through the Main Entrance of [facility] at: 6 am Pick up the phone at the desk and dial [phone] and inform us of your arrival.  Please call this number if you have any problems the morning of surgery: [phone]  Remember: Do not eat food after midnight: Wednesday Do not drink clear liquids after: midnight Wednesday Take these medicines the morning of surgery with a SIP OF WATER:  None  Do not wear jewelry, make-up, or FINGER nail polish No metal in your hair or on your body. Do not wear lotions, powders, perfumes. You may wear deodorant.  Please use your CHG wash as directed prior to surgery.  Do not shave anywhere for at least 12 hours prior to first CHG shower.  Do not bring valuables to the hospital. Contacts, dentures or bridgework may not be worn into surgery.  Leave suitcase in the car. After Surgery it may be brought to your room. For patients being admitted to the hospital, checkout time is 11:00am the day of discharge.  Home with husband "Caryn Bee".

## 2012-09-11 NOTE — H&P (Addendum)
  Karen [name]  [id number]  09/05/12 S: Karen [name] is a 42 year old G2P2 with no further childbearing desires.  Her husband has had a vasectomy.  She has a history of uterine fibroids.  Last year, we performed a saline infusion ultrasound showing several small fibroids with one 12 x 8 mm, which was submucosal at the anterior surface.  She has tried controlling this with birth control pills.  She is currently having to double up on Lo-Ovral just to stop the flow.  Her flow is now excessive where she cannot even wear tampons, lasting 9-10 days a month.  She is taking Lysteda for cycle control and gets some benefit from that.  She desires definitive surgical intervention at this time and wants to proceed with hysterectomy.   O: Physical exam:  Heart:  Regular rate and rhythm.  Lungs clear to auscultation bilaterally.  Abdomen is soft and nontender and nondistended.  Uterus is anteverted, mobile and slightly irregular.  No adnexal masses are palpable.    Past medical history:  Hypothyroidism and gastroesophageal reflux.    Past surgical history:  She has had a thyroidectomy, cholecystectomy and birth of twins.    Allergy to topical hydrocortisone.    Medications:  She is on Protonix, Levothroid 25 mcg, Lo-Ovral birth control pill and Lysteda as needed.   A&P: Fibroids, menorrhagia, not controlled with birth control pills or Lysteda.  She desires definitive surgical intervention.  Plan hysterectomy.  We will proceed with laparoscopically assisted vaginal hysterectomy with preservation of the ovaries.  I discussed the risks and benefits of the procedure at length which include but are not limited to risk of infection, bleeding, damage to bowel, bladder, ureters, ovaries, risks associated with general anesthesia, risks associated with blood transfusion.  We discussed the plan of care, the recovery.  I answered all of her questions.  She has given informed consent. Dineen Kid Rana Snare, MD/rad   This patient has been seen  and examined.   All of her questions were answered.  Labs and vital signs reviewed.  Informed consent has been obtained.  The History and Physical is current.  09/12/12 DL

## 2012-09-12 ENCOUNTER — Encounter (HOSPITAL_COMMUNITY): Payer: Self-pay | Admitting: Anesthesiology

## 2012-09-12 ENCOUNTER — Encounter (HOSPITAL_COMMUNITY): Payer: Self-pay

## 2012-09-12 ENCOUNTER — Observation Stay (HOSPITAL_COMMUNITY)
Admission: RE | Admit: 2012-09-12 | Discharge: 2012-09-13 | Disposition: A | Discharge status: Home / Self Care | Payer: BC Managed Care – PPO | Source: Ambulatory Visit | Attending: Obstetrics and Gynecology | Admitting: Obstetrics and Gynecology

## 2012-09-12 ENCOUNTER — Encounter (HOSPITAL_COMMUNITY)
Admission: RE | Disposition: A | Discharge status: Home / Self Care | Payer: Self-pay | Source: Ambulatory Visit | Attending: Obstetrics and Gynecology

## 2012-09-12 ENCOUNTER — Ambulatory Visit (HOSPITAL_COMMUNITY): Payer: BC Managed Care – PPO | Admitting: Anesthesiology

## 2012-09-12 DIAGNOSIS — N949 Unspecified condition associated with female genital organs and menstrual cycle: Secondary | ICD-10-CM | POA: Insufficient documentation

## 2012-09-12 DIAGNOSIS — D252 Subserosal leiomyoma of uterus: Secondary | ICD-10-CM | POA: Insufficient documentation

## 2012-09-12 DIAGNOSIS — N938 Other specified abnormal uterine and vaginal bleeding: Principal | ICD-10-CM | POA: Insufficient documentation

## 2012-09-12 DIAGNOSIS — N8000 Endometriosis of the uterus, unspecified: Secondary | ICD-10-CM | POA: Insufficient documentation

## 2012-09-12 DIAGNOSIS — D251 Intramural leiomyoma of uterus: Secondary | ICD-10-CM | POA: Insufficient documentation

## 2012-09-12 DIAGNOSIS — Z90710 Acquired absence of both cervix and uterus: Secondary | ICD-10-CM

## 2012-09-12 HISTORY — PX: LAPAROSCOPIC ASSISTED VAGINAL HYSTERECTOMY: SHX5398

## 2012-09-12 SURGERY — HYSTERECTOMY, VAGINAL, LAPAROSCOPY-ASSISTED
Anesthesia: General | Site: Abdomen | Wound class: Clean Contaminated

## 2012-09-12 MED ORDER — MIDAZOLAM HCL 5 MG/5ML IJ SOLN
INTRAMUSCULAR | Status: DC | PRN
  Administered 2012-09-12: 2 mg via INTRAVENOUS

## 2012-09-12 MED ORDER — HYDROMORPHONE HCL PF 1 MG/ML IJ SOLN: 0.2500 mg | INTRAMUSCULAR | Status: DC | PRN

## 2012-09-12 MED ORDER — SCOPOLAMINE 1 MG/3DAYS TD PT72
MEDICATED_PATCH | TRANSDERMAL | Status: AC
  Administered 2012-09-12: 1.5 mg via TRANSDERMAL
  Filled 2012-09-12: qty 1

## 2012-09-12 MED ORDER — DEXAMETHASONE SODIUM PHOSPHATE 4 MG/ML IJ SOLN
INTRAMUSCULAR | Status: DC | PRN
  Administered 2012-09-12: 10 mg via INTRAVENOUS

## 2012-09-12 MED ORDER — LIDOCAINE HCL (CARDIAC) 20 MG/ML IV SOLN
INTRAVENOUS | Status: DC | PRN
  Administered 2012-09-12: 30 mg via INTRAVENOUS
  Administered 2012-09-12: 40 mg via INTRAVENOUS
  Administered 2012-09-12: 30 mg via INTRAVENOUS

## 2012-09-12 MED ORDER — PANTOPRAZOLE SODIUM 20 MG PO TBEC
20.0000 mg | DELAYED_RELEASE_TABLET | Freq: Every day | ORAL | Status: DC
  Administered 2012-09-12: 20 mg via ORAL
  Filled 2012-09-12 (×2): qty 1

## 2012-09-12 MED ORDER — DIPHENHYDRAMINE HCL 50 MG/ML IJ SOLN
INTRAMUSCULAR | Status: AC
  Administered 2012-09-12: 12.5 mg via INTRAVENOUS
  Filled 2012-09-12: qty 1

## 2012-09-12 MED ORDER — NEOSTIGMINE METHYLSULFATE 1 MG/ML IJ SOLN
INTRAMUSCULAR | Status: AC
  Filled 2012-09-12: qty 1

## 2012-09-12 MED ORDER — DIPHENHYDRAMINE HCL 50 MG/ML IJ SOLN
12.5000 mg | Freq: Four times a day (QID) | INTRAMUSCULAR | Status: DC | PRN
  Administered 2012-09-12: 12.5 mg via INTRAVENOUS

## 2012-09-12 MED ORDER — SODIUM CHLORIDE 0.9 % IJ SOLN: 9.0000 mL | INTRAMUSCULAR | Status: DC | PRN

## 2012-09-12 MED ORDER — DIPHENHYDRAMINE HCL 12.5 MG/5ML PO ELIX
12.5000 mg | ORAL_SOLUTION | Freq: Four times a day (QID) | ORAL | Status: DC | PRN
  Filled 2012-09-12: qty 5

## 2012-09-12 MED ORDER — LACTATED RINGERS IV BOLUS (SEPSIS)
500.0000 mL | Freq: Once | INTRAVENOUS | Status: AC
  Administered 2012-09-12: 500 mL via INTRAVENOUS

## 2012-09-12 MED ORDER — LEVOTHYROXINE SODIUM 25 MCG PO TABS
25.0000 ug | ORAL_TABLET | Freq: Every day | ORAL | Status: DC
  Administered 2012-09-12 – 2012-09-13 (×2): 25 ug via ORAL
  Filled 2012-09-12 (×2): qty 1

## 2012-09-12 MED ORDER — HYDROMORPHONE HCL PF 1 MG/ML IJ SOLN
INTRAMUSCULAR | Status: AC
  Filled 2012-09-12: qty 1

## 2012-09-12 MED ORDER — DEXTROSE 5 % IV SOLN
2.0000 g | INTRAVENOUS | Status: AC
  Administered 2012-09-12: 2 g via INTRAVENOUS
  Filled 2012-09-12: qty 2

## 2012-09-12 MED ORDER — BUPIVACAINE HCL (PF) 0.25 % IJ SOLN
INTRAMUSCULAR | Status: AC
  Filled 2012-09-12: qty 30

## 2012-09-12 MED ORDER — MIDAZOLAM HCL 2 MG/2ML IJ SOLN
INTRAMUSCULAR | Status: AC
  Filled 2012-09-12: qty 2

## 2012-09-12 MED ORDER — KETOROLAC TROMETHAMINE 30 MG/ML IJ SOLN: INTRAMUSCULAR | Status: DC | PRN

## 2012-09-12 MED ORDER — DEXTROSE-NACL 5-0.45 % IV SOLN
INTRAVENOUS | Status: DC
  Administered 2012-09-12 – 2012-09-13 (×3): via INTRAVENOUS

## 2012-09-12 MED ORDER — ROCURONIUM BROMIDE 100 MG/10ML IV SOLN
INTRAVENOUS | Status: DC | PRN
  Administered 2012-09-12: 35 mg via INTRAVENOUS

## 2012-09-12 MED ORDER — OXYCODONE-ACETAMINOPHEN 5-325 MG PO TABS
1.0000 | ORAL_TABLET | ORAL | Status: DC | PRN
  Administered 2012-09-13: 1 via ORAL
  Filled 2012-09-12: qty 1

## 2012-09-12 MED ORDER — MEPERIDINE HCL 25 MG/ML IJ SOLN: 6.2500 mg | INTRAMUSCULAR | Status: DC | PRN

## 2012-09-12 MED ORDER — ZOLPIDEM TARTRATE 5 MG PO TABS: 5.0000 mg | ORAL_TABLET | Freq: Every evening | ORAL | Status: DC | PRN

## 2012-09-12 MED ORDER — HYDROMORPHONE HCL PF 1 MG/ML IJ SOLN: 0.2000 mg | INTRAMUSCULAR | Status: DC | PRN

## 2012-09-12 MED ORDER — MENTHOL 3 MG MT LOZG: 1.0000 | LOZENGE | OROMUCOSAL | Status: DC | PRN

## 2012-09-12 MED ORDER — LIDOCAINE HCL (CARDIAC) 20 MG/ML IV SOLN
INTRAVENOUS | Status: AC
  Filled 2012-09-12: qty 5

## 2012-09-12 MED ORDER — FENTANYL CITRATE 0.05 MG/ML IJ SOLN
INTRAMUSCULAR | Status: AC
  Filled 2012-09-12: qty 5

## 2012-09-12 MED ORDER — PROMETHAZINE HCL 25 MG/ML IJ SOLN: 6.2500 mg | INTRAMUSCULAR | Status: DC | PRN

## 2012-09-12 MED ORDER — KETOROLAC TROMETHAMINE 30 MG/ML IJ SOLN: 15.0000 mg | Freq: Once | INTRAMUSCULAR | Status: DC | PRN

## 2012-09-12 MED ORDER — KETOROLAC TROMETHAMINE 30 MG/ML IJ SOLN
INTRAMUSCULAR | Status: AC
  Filled 2012-09-12: qty 1

## 2012-09-12 MED ORDER — ONDANSETRON HCL 4 MG/2ML IJ SOLN: 4.0000 mg | Freq: Four times a day (QID) | INTRAMUSCULAR | Status: DC | PRN

## 2012-09-12 MED ORDER — ROCURONIUM BROMIDE 50 MG/5ML IV SOLN
INTRAVENOUS | Status: AC
  Filled 2012-09-12: qty 1

## 2012-09-12 MED ORDER — IBUPROFEN 600 MG PO TABS
600.0000 mg | ORAL_TABLET | Freq: Four times a day (QID) | ORAL | Status: DC | PRN
  Administered 2012-09-13: 600 mg via ORAL
  Filled 2012-09-12: qty 1

## 2012-09-12 MED ORDER — NEOSTIGMINE METHYLSULFATE 1 MG/ML IJ SOLN
INTRAMUSCULAR | Status: DC | PRN
  Administered 2012-09-12: 3 mg via INTRAVENOUS

## 2012-09-12 MED ORDER — ONDANSETRON HCL 4 MG/2ML IJ SOLN
INTRAMUSCULAR | Status: DC | PRN
  Administered 2012-09-12: 4 mg via INTRAVENOUS

## 2012-09-12 MED ORDER — PROPOFOL 10 MG/ML IV EMUL
INTRAVENOUS | Status: AC
  Filled 2012-09-12: qty 20

## 2012-09-12 MED ORDER — NALOXONE HCL 0.4 MG/ML IJ SOLN: 0.4000 mg | INTRAMUSCULAR | Status: DC | PRN

## 2012-09-12 MED ORDER — GLYCOPYRROLATE 0.2 MG/ML IJ SOLN
INTRAMUSCULAR | Status: DC | PRN
  Administered 2012-09-12: 0.4 mg via INTRAVENOUS

## 2012-09-12 MED ORDER — GLYCOPYRROLATE 0.2 MG/ML IJ SOLN
INTRAMUSCULAR | Status: AC
  Filled 2012-09-12: qty 2

## 2012-09-12 MED ORDER — LACTATED RINGERS IV SOLN
INTRAVENOUS | Status: DC
  Administered 2012-09-12 (×2): via INTRAVENOUS

## 2012-09-12 MED ORDER — ONDANSETRON HCL 4 MG/2ML IJ SOLN
INTRAMUSCULAR | Status: AC
  Filled 2012-09-12: qty 2

## 2012-09-12 MED ORDER — FENTANYL CITRATE 0.05 MG/ML IJ SOLN
INTRAMUSCULAR | Status: DC | PRN
  Administered 2012-09-12 (×2): 50 ug via INTRAVENOUS
  Administered 2012-09-12: 100 ug via INTRAVENOUS
  Administered 2012-09-12: 50 ug via INTRAVENOUS
  Administered 2012-09-12: 100 ug via INTRAVENOUS

## 2012-09-12 MED ORDER — DEXAMETHASONE SODIUM PHOSPHATE 10 MG/ML IJ SOLN
INTRAMUSCULAR | Status: AC
  Filled 2012-09-12: qty 1

## 2012-09-12 MED ORDER — HYDROMORPHONE HCL PF 1 MG/ML IJ SOLN
INTRAMUSCULAR | Status: DC | PRN
  Administered 2012-09-12: 1 mg via INTRAVENOUS

## 2012-09-12 MED ORDER — PROPOFOL 10 MG/ML IV EMUL
INTRAVENOUS | Status: DC | PRN
  Administered 2012-09-12: 170 mg via INTRAVENOUS

## 2012-09-12 MED ORDER — SCOPOLAMINE 1 MG/3DAYS TD PT72
1.0000 | MEDICATED_PATCH | Freq: Once | TRANSDERMAL | Status: DC
Start: 2012-09-12 — End: 2012-09-12
  Administered 2012-09-12: 1.5 mg via TRANSDERMAL

## 2012-09-12 MED ORDER — HYDROMORPHONE 0.3 MG/ML IV SOLN
INTRAVENOUS | Status: DC
  Administered 2012-09-12: 1.2 mg via INTRAVENOUS
  Administered 2012-09-12: 1.42 mg via INTRAVENOUS
  Administered 2012-09-12: 11:00:00 via INTRAVENOUS
  Administered 2012-09-12: 1.59 mg via INTRAVENOUS
  Administered 2012-09-13: 0.4 mg via INTRAVENOUS
  Administered 2012-09-13: 0.399 mg via INTRAVENOUS
  Administered 2012-09-13: 0.4 mg via INTRAVENOUS
  Filled 2012-09-12: qty 25

## 2012-09-12 SURGICAL SUPPLY — 40 items
BLADE SURG 15 STRL LF C SS BP (BLADE) ×1 IMPLANT
BLADE SURG 15 STRL SS (BLADE) ×1
CABLE HIGH FREQUENCY MONO STRZ (ELECTRODE) IMPLANT
CATH ROBINSON RED A/P 16FR (CATHETERS) ×2 IMPLANT
CLOTH BEACON ORANGE TIMEOUT ST (SAFETY) ×2 IMPLANT
CONT PATH 16OZ SNAP LID 3702 (MISCELLANEOUS) ×2 IMPLANT
COVER TABLE BACK 60X90 (DRAPES) ×2 IMPLANT
DECANTER SPIKE VIAL GLASS SM (MISCELLANEOUS) IMPLANT
DERMABOND ADVANCED (GAUZE/BANDAGES/DRESSINGS) ×1
DERMABOND ADVANCED .7 DNX12 (GAUZE/BANDAGES/DRESSINGS) ×1 IMPLANT
ELECT LIGASURE LONG (ELECTRODE) ×2 IMPLANT
ELECT REM PT RETURN 9FT ADLT (ELECTROSURGICAL)
ELECTRODE REM PT RTRN 9FT ADLT (ELECTROSURGICAL) IMPLANT
FORCEPS CUTTING 45CM 5MM (CUTTING FORCEPS) ×2 IMPLANT
GLOVE BIO SURGEON STRL SZ8 (GLOVE) ×2 IMPLANT
GLOVE BIOGEL PI IND STRL 6.5 (GLOVE) ×1 IMPLANT
GLOVE BIOGEL PI INDICATOR 6.5 (GLOVE) ×1
GLOVE SURG ORTHO 8.0 STRL STRW (GLOVE) ×6 IMPLANT
GOWN STRL REIN XL XLG (GOWN DISPOSABLE) ×8 IMPLANT
NEEDLE INSUFFLATION 14GA 120MM (NEEDLE) ×2 IMPLANT
NS IRRIG 1000ML POUR BTL (IV SOLUTION) ×2 IMPLANT
PACK LAVH (CUSTOM PROCEDURE TRAY) ×2 IMPLANT
PROTECTOR NERVE ULNAR (MISCELLANEOUS) ×2 IMPLANT
SET IRRIG TUBING LAPAROSCOPIC (IRRIGATION / IRRIGATOR) IMPLANT
SOLUTION ELECTROLUBE (MISCELLANEOUS) IMPLANT
STRIP CLOSURE SKIN 1/4X3 (GAUZE/BANDAGES/DRESSINGS) IMPLANT
SUT MNCRL 0 MO-4 VIOLET 18 CR (SUTURE) ×2 IMPLANT
SUT MNCRL 0 VIOLET 6X18 (SUTURE) ×1 IMPLANT
SUT MNCRL AB 0 CT1 27 (SUTURE) IMPLANT
SUT MON AB 2-0 CT1 36 (SUTURE) IMPLANT
SUT MONOCRYL 0 6X18 (SUTURE) ×1
SUT MONOCRYL 0 MO 4 18  CR/8 (SUTURE) ×2
SUT VICRYL 0 UR6 27IN ABS (SUTURE) ×2 IMPLANT
SUT VICRYL RAPIDE 3 0 (SUTURE) ×2 IMPLANT
TOWEL OR 17X24 6PK STRL BLUE (TOWEL DISPOSABLE) ×4 IMPLANT
TRAY FOLEY CATH 14FR (SET/KITS/TRAYS/PACK) ×2 IMPLANT
TROCAR Z-THREAD BLADED 11X100M (TROCAR) ×2 IMPLANT
TROCAR Z-THREAD BLADED 5X100MM (TROCAR) ×2 IMPLANT
WARMER LAPAROSCOPE (MISCELLANEOUS) ×2 IMPLANT
WATER STERILE IRR 1000ML POUR (IV SOLUTION) IMPLANT

## 2012-09-12 NOTE — Anesthesia Preprocedure Evaluation (Signed)
Anesthesia Evaluation    Reviewed: Allergy & Precautions, H&P , NPO status , Patient's Chart, lab work & pertinent test results  History of Anesthesia Complications (+) PONV  Airway Mallampati: II TM Distance: >3 FB Neck ROM: full    Dental No notable dental hx. (+) Teeth Intact   Pulmonary neg pulmonary ROS,    Pulmonary exam normal       Cardiovascular negative cardio ROS      Neuro/Psych PSYCHIATRIC DISORDERS Depression negative neurological ROS     GI/Hepatic negative GI ROS, Neg liver ROS, GERD-  ,  Endo/Other  negative endocrine ROSHypothyroidism   Renal/GU negative Renal ROS  negative genitourinary   Musculoskeletal negative musculoskeletal ROS (+)   Abdominal Normal abdominal exam  (+)   Peds negative pediatric ROS (+)  Hematology negative hematology ROS (+)   Anesthesia Other Findings   Reproductive/Obstetrics negative OB ROS                           Anesthesia Physical Anesthesia Plan  ASA: II  Anesthesia Plan: General   Post-op Pain Management:    Induction: Intravenous  Airway Management Planned: Oral ETT  Additional Equipment:   Intra-op Plan:   Post-operative Plan: Extubation in OR  Informed Consent: I have reviewed the patients History and Physical, chart, labs and discussed the procedure including the risks, benefits and alternatives for the proposed anesthesia with the patient or authorized representative who has indicated his/her understanding and acceptance.   Dental Advisory Given  Plan Discussed with: CRNA and Surgeon  Anesthesia Plan Comments:         Anesthesia Quick Evaluation

## 2012-09-12 NOTE — Brief Op Note (Signed)
09/12/2012  8:43 AM  PATIENT:  Karen [name]  42 y.o. female  PRE-OPERATIVE DIAGNOSIS:  fibroids;menorrhagia  POST-OPERATIVE DIAGNOSIS:  fibroids;menorrhagia  PROCEDURE:  Procedure(s) (LRB) with comments: LAPAROSCOPIC ASSISTED VAGINAL HYSTERECTOMY (N/A)  SURGEON:  Surgeon(s) and Role:    * Turner Daniels, MD - Primary    * Mitchel Honour, DO - Assisting  PHYSICIAN ASSISTANT:   ASSISTANTS:    ANESTHESIA:   general  EBL:  Total I/O In: 1600 [I.V.:1600] Out: 300 [Urine:150; Blood:150]  BLOOD ADMINISTERED:none  DRAINS: Urinary Catheter (Foley)   LOCAL MEDICATIONS USED:  MARCAINE     SPECIMEN:  Source of Specimen:  uterus  DISPOSITION OF SPECIMEN:  PATHOLOGY  COUNTS:  YES  TOURNIQUET:  * No tourniquets in log *  DICTATION: .Other Dictation: Dictation Number   PLAN OF CARE: Admit for overnight observation  PATIENT DISPOSITION:  PACU - hemodynamically stable.   Delay start of Pharmacological VTE agent (>24hrs) due to surgical blood loss or risk of bleeding: no

## 2012-09-12 NOTE — Anesthesia Postprocedure Evaluation (Signed)
Anesthesia Post Note  Patient: Karen [name]  Procedure(s) Performed: Procedure(s) (LRB): LAPAROSCOPIC ASSISTED VAGINAL HYSTERECTOMY (N/A)  Anesthesia type: General  Patient location: PACU  Post pain: Pain level controlled  Post assessment: Post-op Vital signs reviewed  Last Vitals:  Filed Vitals:   09/12/12 0851  BP: 139/64  Pulse: 115  Temp: 36.1 C  Resp: 14    Post vital signs: Reviewed  Level of consciousness: sedated  Complications: No apparent anesthesia complications

## 2012-09-12 NOTE — Addendum Note (Signed)
Addendum  created 09/12/12 1610 by Suella Grove, CRNA   Modules edited:Anesthesia Responsible Staff

## 2012-09-12 NOTE — OR Nursing (Signed)
Patient had splotchy rash across upper abdomen at start of procedure. Rash not present at end of case.

## 2012-09-12 NOTE — Op Note (Signed)
Karen [name], Karen [name]             ACCOUNT NO.:  [id number]  MEDICAL RECORD NO.:  [id number]  LOCATION:  9309                          FACILITY:  [facility]  PHYSICIAN:  Dineen Kid. Rana Snare, M.D.    DATE OF BIRTH:  April 07, 1971  DATE OF PROCEDURE:  09/12/2012 DATE OF DISCHARGE:                              OPERATIVE REPORT   PREOPERATIVE DIAGNOSIS:  Menorrhagia, fibroids.  Menorrhagia is not controlled with conservative management.  POSTOPERATIVE DIAGNOSIS:  Menorrhagia, fibroids.  Menorrhagia is not controlled with conservative management.  PROCEDURE:  Laparoscopic-assisted vaginal hysterectomy.  SURGEON:  Dineen Kid. Rana Snare, MD.  ASSISTANTMitchel Honour, DO  ANESTHESIA:  General endotracheal.  INDICATIONS:  Ms. Karen [name] is a 42 year old with no further childbearing desires underwent a saline infusion ultrasound which showed multiple fibroids including a submucosal fibroid.  She has continued to have abnormal bleeding to the point that 10-14 days out of the month requiring multiple pads not allowing her to do her normal social functions including taking care of her family.  This has not been controlled with birth control pills.  She desires definitive surgical intervention and requests hysterectomy.  Plan laparoscopic-assisted vaginal hysterectomy.  The risks and benefits of the procedure were discussed at length.  Informed consent was obtained.  Around the time of surgery, normal-appearing liver and appendix.  Uterus was slightly enlarged and irregular consistent with fibroids.  Ovaries appeared to be normal.  DESCRIPTION OF PROCEDURE:  After adequate analgesia, the patient was placed in a dorsal lithotomy position.  She was sterilely prepped and draped and bladder sterilely drained.  Graves speculum was placed on the anterior lip of the cervix.  A 1 cm infraumbilical skin incision was made.  A Veress needle was inserted.  The abdomen was insufflated with dullness to percussion.  An 11 mm  trocar was inserted.  The above findings were noted by laparoscope.  A 5-mm trocar was inserted left of the midline 2 fingerbreadths above pubic symphysis under direct visualization.  After careful and systematic evaluation of the abdomen and pelvis, a Gyrus instrument was used to ligate across the left and right utero-ovarian ligaments down across the round ligament to the inferior portion of broad ligaments with the left tube and ovary and the right tube and ovary falling laterally with good hemostasis achieved. Bladder flap was created by elevating the bladder and making a small window at the uterovesical junction  with good hemostasis achieved.  The abdomen was then deflated.  The legs were repositioned.  A weighted speculum placed in the vagina.  Posterior colpotomy was then performed. The cervix was then circumscribed with Bovie cautery.  LigaSure instrument was used to ligate across the uterosacral ligaments bilaterally, the cardinal ligaments bilaterally, bladder pillars bilaterally.  The bladder was then dissected off the anterior surface of the cervix, and a Deaver retractor was placed underneath the bladder. LigaSure instrument was then used to ligate the uterine vasculature bilaterally up to the inferior portion of the broad ligaments.  Uterus was removed.  A small packing was then placed.  The uterosacral ligaments were identified, suture ligated with figure-of-eight of 0 Monocryl suture bilaterally.  Posterior peritoneum was then closed in a pursestring fashion.  The vagina was then closed in a vertical fashion using figure-of-eights of 0 Monocryl suture.  Packing had been removed, and the uterosacral ligaments were plicated in the midline, and after the vagina have been closed good hemostasis and good support was noted. Foley catheter was in place.  Return of clear yellow urine.  The abdomen was then re-insufflated.  Unusual suction irrigator was used to irrigate the  pelvis.  Re-examination of the pedicles revealed good hemostasis. Small peritoneal edges were cauterized using bipolar cautery.  The ureters were identified bilaterally.  Good peristalsis noted.  After copious amount of irrigation, adequate hemostasis was assured and the pedicle screws have good hemostasis.  The abdomen was then desufflated and trocars were removed.  The infraumbilical skin incision was closed with 0 Vicryl interrupted suture in the fascia, 3-0 Vicryl Rapide subcuticular suture.  The 5-mm site was then closed with 3-0 Vicryl Rapide subcuticular suture.  Incisions were injected with 0.25% Marcaine, total 10 mL used.  The patient was then transferred to recovery room in stable condition.  Sponge and instrument count were normal x3.  Estimated blood loss 150 mL.  The patient received 2 g of cefotetan preoperatively.  DISPOSITION:  The patient will be admitted for overnight observation. She was stable and transferred to the recovery room.     Dineen Kid Rana Snare, M.D.     DCL/MEDQ  D:  09/12/2012  T:  09/12/2012  Job:  [id number]

## 2012-09-12 NOTE — Transfer of Care (Signed)
Immediate Anesthesia Transfer of Care Note  Patient: Karen [name]  Procedure(s) Performed: Procedure(s) (LRB) with comments: LAPAROSCOPIC ASSISTED VAGINAL HYSTERECTOMY (N/A)  Patient Location: PACU  Anesthesia Type:General  Level of Consciousness: awake, sedated and patient cooperative  Airway & Oxygen Therapy: Patient Spontanous Breathing and Patient connected to nasal cannula oxygen  Post-op Assessment: Report given to PACU RN and Post -op Vital signs reviewed and stable  Post vital signs: Reviewed and stable  Complications: No apparent anesthesia complications

## 2012-09-13 ENCOUNTER — Encounter (HOSPITAL_COMMUNITY): Payer: Self-pay | Admitting: Obstetrics and Gynecology

## 2012-09-13 LAB — CBC
HCT: 32.8 % — ABNORMAL LOW (ref 36.0–46.0)
Hemoglobin: 10.4 g/dL — ABNORMAL LOW (ref 12.0–15.0)
MCHC: 31.7 g/dL (ref 30.0–36.0)
MCV: 78.5 fL (ref 78.0–100.0)
RDW: 14.9 % (ref 11.5–15.5)

## 2012-09-13 MED ORDER — OXYCODONE-ACETAMINOPHEN 5-325 MG PO TABS: 1.0000 | ORAL_TABLET | ORAL | Status: DC | PRN

## 2012-09-13 MED ORDER — IBUPROFEN 600 MG PO TABS: 600.0000 mg | ORAL_TABLET | Freq: Four times a day (QID) | ORAL | Status: DC | PRN

## 2012-09-13 NOTE — Addendum Note (Signed)
Addendum  created 09/13/12 0738 by Saskia Simerson C Arvind Mexicano, CRNA   Modules edited:Notes Section    

## 2012-09-13 NOTE — Discharge Summary (Signed)
Physician Discharge Summary  Patient ID: Karen [name] MRN: [id number] DOB/AGE: 1970/09/13 42 y.o.  Admit date: 09/12/2012 Discharge date: 09/13/2012  Admission Diagnoses:AUB, Fibroids  Discharge Diagnoses: Same Active Problems:  * No active hospital problems. *    Discharged Condition: good  Hospital Course: Pt underwent an uncomplicated LAVH with EBL 150cc.  Her post op course was unremarkable with good pain relief with IV meds, then oral meds.  Good return of bowel and bladder function and on POD 1 was tolerated a regular diet, passing flatus and desired d/c home.  Consults: None  Significant Diagnostic Studies: labs: 10,4  Treatments: surgery: LAVH  Discharge Exam: Blood pressure 99/68, pulse 80, temperature 98.2 F (36.8 C), temperature source Oral, resp. rate 16, height 5' 2.5" (1.588 m), weight 79.379 kg (175 lb), SpO2 100.00%. Abd soft nt, nd BS+ Minimal vag bleeding Ext  Neg homans  Disposition: 01-Home or Self Care  Discharge Orders    Future Orders Please Complete By Expires   Diet general      Increase activity slowly      Driving Restrictions      Comments:   No driving for 2 weeks   Lifting restrictions      Comments:   No lifting anything greater than 10 pounds (if you have to ask, don't lift it)   Sexual Activity Restrictions      Comments:   Nothing in the vagina for 6 weeks   Call MD for:  temperature >100.4      Call MD for:  persistant nausea and vomiting      Call MD for:  severe uncontrolled pain      Call MD for:  redness, tenderness, or signs of infection (pain, swelling, redness, odor or green/yellow discharge around incision site)      Call MD for:  difficulty breathing, headache or visual disturbances          Medication List     As of 09/13/2012  8:08 AM    STOP taking these medications         LYSTEDA 650 MG Tabs   Generic drug: tranexamic acid      norethindrone-ethinyl estradiol 0.5-35 MG-MCG tablet   Commonly known as:  NECON,BREVICON,MODICON      TAKE these medications         ibuprofen 600 MG tablet   Commonly known as: ADVIL,MOTRIN   Take 1 tablet (600 mg total) by mouth every 6 (six) hours as needed (mild pain).      LEVOTHROID 25 MCG tablet   Generic drug: levothyroxine   Take 25 mcg by mouth daily.      loratadine-pseudoephedrine 10-240 MG per 24 hr tablet   Commonly known as: CLARITIN-D 24-hour   Take 1 tablet by mouth daily.      multivitamin with minerals Tabs   Take 1 tablet by mouth daily.      oxyCODONE-acetaminophen 5-325 MG per tablet   Commonly known as: PERCOCET/ROXICET   Take 1-2 tablets by mouth every 4 (four) hours as needed (moderate to severe pain (when tolerating fluids)).      pantoprazole 20 MG tablet   Commonly known as: PROTONIX   Take 20 mg by mouth daily.         Signed: Alexias Margerum C 09/13/2012, 8:08 AM

## 2012-09-13 NOTE — Anesthesia Postprocedure Evaluation (Signed)
  Anesthesia Post-op Note  Patient: Karen [name]  Procedure(s) Performed: Procedure(s) (LRB) with comments: LAPAROSCOPIC ASSISTED VAGINAL HYSTERECTOMY (N/A)  Patient Location: [facility]  Anesthesia Type:General  Level of Consciousness: awake and patient cooperative  Airway and Oxygen Therapy: Patient Spontanous Breathing  Post-op Pain: none  Post-op Assessment: Patient's Cardiovascular Status Stable and Respiratory Function Stable  Post-op Vital Signs: Reviewed and stable  Complications: No apparent anesthesia complications

## 2012-09-28 ENCOUNTER — Other Ambulatory Visit: Payer: Self-pay

## 2012-12-18 ENCOUNTER — Other Ambulatory Visit: Payer: Self-pay | Admitting: Endocrinology

## 2012-12-18 DIAGNOSIS — E041 Nontoxic single thyroid nodule: Secondary | ICD-10-CM

## 2012-12-26 ENCOUNTER — Ambulatory Visit
Admission: RE | Admit: 2012-12-26 | Discharge: 2012-12-26 | Disposition: A | Discharge status: Home / Self Care | Payer: BC Managed Care – PPO | Source: Ambulatory Visit | Attending: Endocrinology | Admitting: Endocrinology

## 2012-12-26 DIAGNOSIS — E041 Nontoxic single thyroid nodule: Secondary | ICD-10-CM

## 2013-05-02 ENCOUNTER — Encounter: Payer: Self-pay | Admitting: Family Medicine

## 2013-05-02 ENCOUNTER — Ambulatory Visit (INDEPENDENT_AMBULATORY_CARE_PROVIDER_SITE_OTHER): Payer: BC Managed Care – PPO | Admitting: Family Medicine

## 2013-05-02 VITALS — BP 122/82 | HR 104 | Temp 98.4°F | Wt 179.0 lb

## 2013-05-02 DIAGNOSIS — J069 Acute upper respiratory infection, unspecified: Secondary | ICD-10-CM

## 2013-05-02 DIAGNOSIS — R197 Diarrhea, unspecified: Secondary | ICD-10-CM

## 2013-05-02 MED ORDER — DIPHENOXYLATE-ATROPINE 2.5-0.025 MG PO TABS: 1.0000 | ORAL_TABLET | Freq: Four times a day (QID) | ORAL | Status: DC | PRN

## 2013-05-02 MED ORDER — PROMETHAZINE-DM 6.25-15 MG/5ML PO SYRP: 5.0000 mL | ORAL_SOLUTION | Freq: Four times a day (QID) | ORAL | Status: DC | PRN

## 2013-05-02 NOTE — Patient Instructions (Addendum)
This appears to be a virus x2 Use the Lomotil for diarrhea Use the cough syrup as needed- will cause drowsiness (and also help nausea) REST! Drink plenty of fluids Hang in there!

## 2013-05-02 NOTE — Assessment & Plan Note (Signed)
New.  No evidence of bacterial infxn.  Cough syrup prn.  Reviewed supportive care and red flags that should prompt return.  Pt expressed understanding and is in agreement w/ plan.  

## 2013-05-02 NOTE — Assessment & Plan Note (Signed)
New.  Suspect viral illness- no recent travel, abx, change in food.  Start Lomotil prn.  Encouraged increased fluids to avoid dehydration.  Will follow.

## 2013-05-02 NOTE — Progress Notes (Signed)
  Subjective:    Patient ID: Karen [name], female    DOB: 04-10-1971, 42 y.o.   MRN: [id number]  HPI URI- Tuesday woke up w/ nausea and vomited multiple times.  Then developed diarrhea.  Spent the day in bed.  Then developed PND, chest congestion, cough.  Nausea and vomiting has improved but diarrhea has not.  This AM had liquid stools- 10-12 stools since 5am.  Low grade temp on Wed.  + HA.  No sinus pain.  No known sick contacts.  Has not taken Immodium but did try Gaviscon w/ some relief.   Review of Systems For ROS see HPI     Objective:   Physical Exam  Vitals reviewed. Constitutional: She is oriented to person, place, and time. She appears well-developed and well-nourished. No distress.  HENT:  Head: Normocephalic and atraumatic.  TMs normal bilaterally Mild nasal congestion Throat w/out erythema, edema, or exudate  Eyes: Conjunctivae and EOM are normal. Pupils are equal, round, and reactive to light.  Neck: Normal range of motion. Neck supple.  Cardiovascular: Normal rate, regular rhythm, normal heart sounds and intact distal pulses.   No murmur heard. Pulmonary/Chest: Effort normal and breath sounds normal. No respiratory distress. She has no wheezes. She has no rales.  + hacking cough  Abdominal: Soft. She exhibits no distension. There is no tenderness. There is no rebound.  Hyperactive BS  Lymphadenopathy:    She has no cervical adenopathy.  Neurological: She is alert and oriented to person, place, and time.  Skin: Skin is warm and dry.          Assessment & Plan:

## 2013-06-19 ENCOUNTER — Other Ambulatory Visit: Payer: Self-pay

## 2013-12-18 ENCOUNTER — Other Ambulatory Visit: Payer: Self-pay | Admitting: Endocrinology

## 2013-12-18 DIAGNOSIS — E041 Nontoxic single thyroid nodule: Secondary | ICD-10-CM

## 2014-08-26 ENCOUNTER — Other Ambulatory Visit: Payer: Self-pay | Admitting: Gastroenterology

## 2014-09-02 ENCOUNTER — Other Ambulatory Visit (HOSPITAL_COMMUNITY): Payer: Self-pay | Admitting: Gastroenterology

## 2014-09-02 DIAGNOSIS — R112 Nausea with vomiting, unspecified: Secondary | ICD-10-CM

## 2014-09-16 ENCOUNTER — Ambulatory Visit (HOSPITAL_COMMUNITY)
Admission: RE | Admit: 2014-09-16 | Discharge: 2014-09-16 | Disposition: A | Discharge status: Home / Self Care | Payer: BLUE CROSS/BLUE SHIELD | Source: Ambulatory Visit | Attending: Gastroenterology | Admitting: Gastroenterology

## 2014-09-16 DIAGNOSIS — R140 Abdominal distension (gaseous): Secondary | ICD-10-CM | POA: Diagnosis not present

## 2014-09-16 DIAGNOSIS — R109 Unspecified abdominal pain: Secondary | ICD-10-CM | POA: Insufficient documentation

## 2014-09-16 DIAGNOSIS — R112 Nausea with vomiting, unspecified: Secondary | ICD-10-CM | POA: Diagnosis present

## 2014-09-16 MED ORDER — TECHNETIUM TC 99M SULFUR COLLOID
2.2000 | Freq: Once | INTRAVENOUS | Status: AC | PRN
  Administered 2014-09-16: 2.2 via INTRAVENOUS

## 2014-09-18 ENCOUNTER — Ambulatory Visit (INDEPENDENT_AMBULATORY_CARE_PROVIDER_SITE_OTHER): Payer: BLUE CROSS/BLUE SHIELD | Admitting: Internal Medicine

## 2014-09-18 ENCOUNTER — Encounter: Payer: Self-pay | Admitting: Internal Medicine

## 2014-09-18 ENCOUNTER — Other Ambulatory Visit: Payer: Self-pay

## 2014-09-18 DIAGNOSIS — R1110 Vomiting, unspecified: Secondary | ICD-10-CM

## 2014-09-18 LAB — CBC WITH DIFFERENTIAL/PLATELET
BASOS ABS: 0 10*3/uL (ref 0.0–0.1)
Basophils Relative: 0 % (ref 0–1)
EOS ABS: 0.1 10*3/uL (ref 0.0–0.7)
EOS PCT: 1 % (ref 0–5)
HEMATOCRIT: 43.4 % (ref 36.0–46.0)
HEMOGLOBIN: 14.5 g/dL (ref 12.0–15.0)
Lymphocytes Relative: 27 % (ref 12–46)
Lymphs Abs: 3.4 10*3/uL (ref 0.7–4.0)
MCH: 27.2 pg (ref 26.0–34.0)
MCHC: 33.4 g/dL (ref 30.0–36.0)
MCV: 81.3 fL (ref 78.0–100.0)
MONO ABS: 1 10*3/uL (ref 0.1–1.0)
MPV: 11.9 fL (ref 8.6–12.4)
Monocytes Relative: 8 % (ref 3–12)
NEUTROS ABS: 8.1 10*3/uL — AB (ref 1.7–7.7)
Neutrophils Relative %: 64 % (ref 43–77)
Platelets: 304 10*3/uL (ref 150–400)
RBC: 5.34 MIL/uL — ABNORMAL HIGH (ref 3.87–5.11)
RDW: 14.1 % (ref 11.5–15.5)
WBC: 12.7 10*3/uL — AB (ref 4.0–10.5)

## 2014-09-18 NOTE — Progress Notes (Signed)
Subjective:    Patient ID: Karen [name], female    DOB: 12/09/1970, 44 y.o.   MRN: [id number]  DOS:  09/18/2014 Type of visit - description : acute Interval history: Having GI problems for several months, symptoms started about 10 months ago with diarrhea that was treated with several different medications by her GI doctor Dr. Michail Sermon. Then in November 2015 she started with persistent nausea, can be mild  sometimes but also severe and associated with vomiting. They did a EGD 08/26/2014, biopsy showed mild chronic gastritis and no H. pylori. She had a gastric emptying study 09/16/2014, yesterday, she just got a call from GI stating that she will be referred to [facility] for further eval. At this point, the nausea is definitely affecting her ability to work, making  very difficult even  her activities of daily living. She has been off work on and off but states that me as her primary doctor need to pick up the paperwork for short-term disability from now.   Review of Systems  no fever or chills Occasional vomiting, no hematemesis or blood in the stools. On and off diarrhea No dysphagia or odynophagia Complains of bloating since she started with nausea   Past Medical History  Diagnosis Date  . Thyroid nodule   . Seasonal allergies   . GERD (gastroesophageal reflux disease)   . Hypothyroidism   . PONV (postoperative nausea and vomiting)   . SVD (spontaneous vaginal delivery)     x 1    Past Surgical History  Procedure Laterality Date  . Thyroidectomy, partial    . Cholecystectomy    . Wisdom tooth extraction    . Eye surgery      lasik  bilateral  . Laparoscopic assisted vaginal hysterectomy  09/12/2012    Procedure: LAPAROSCOPIC ASSISTED VAGINAL HYSTERECTOMY;  Surgeon: Luz Lex, MD;  Location: [facility];  Service: Gynecology;  Laterality: N/A;    History   Social History  . Marital Status: Married    Spouse Name: N/A    Number of Children: 2  . Years of Education:  N/A   Occupational History  . [employer]    Social History Main Topics  . Smoking status: Never Smoker   . Smokeless tobacco: Never Used     Comment: never smoked  . Alcohol Use: No  . Drug Use: No  . Sexual Activity: Yes    Birth Control/ Protection: Pill   Other Topics Concern  . Not on file   Social History Narrative        Medication List       This list is accurate as of: 09/18/14 11:59 PM.  Always use your most recent med list.               cholestyramine 4 G packet  Commonly known as:  QUESTRAN  Take 4 g by mouth 2 (two) times daily.     diphenoxylate-atropine 2.5-0.025 MG per tablet  Commonly known as:  LOMOTIL  Take 1 tablet by mouth 4 (four) times daily as needed for diarrhea or loose stools.     FLORASTOR PO  Take 2 tablets by mouth daily.     LEVOTHROID 25 MCG tablet  Generic drug:  levothyroxine  Take 25 mcg by mouth daily.     multivitamin with minerals Tabs tablet  Take 1 tablet by mouth daily.     pantoprazole 20 MG tablet  Commonly known as:  PROTONIX  Take 20 mg by  mouth daily.     promethazine-dextromethorphan 6.25-15 MG/5ML syrup  Commonly known as:  PROMETHAZINE-DM  Take 5 mLs by mouth 4 (four) times daily as needed.     sucralfate 1 G tablet  Commonly known as:  CARAFATE  Take 1 g by mouth 2 (two) times daily.     ZOFRAN PO  Take by mouth. AS NEEDED FOR NAUSEA AND/OR VOMITING           Objective:   Physical Exam  Constitutional: She is oriented to person, place, and time. She appears well-developed. No distress.  HENT:  Head: Normocephalic and atraumatic.  Cardiovascular:  RRR, no murmur , rub or gallop  Pulmonary/Chest: Effort normal. No respiratory distress.  CTA B  Abdominal: Soft. Bowel sounds are normal. She exhibits no distension and no mass. There is no tenderness. There is no rebound and no guarding.  No organomegaly  Musculoskeletal: She exhibits no edema or tenderness.  Neurological: She is alert and  oriented to person, place, and time. No cranial nerve deficit. She exhibits normal muscle tone. Coordination normal.  Speech normal, gait unassisted and normal for age, motor strength appropriate for age   Skin: Skin is warm and dry. No pallor.  No jaundice  Psychiatric: Judgment and thought content normal.  Seems to be distressed and upset about the whole situation with persistent nausea  Vitals reviewed.       Assessment & Plan:        Problem List Items Addressed This Visit      Digestive   NAUSEA AND VOMITING    Persistent nausea, has seen GI for the last few weeks. Status post a EGD and a gastric emptying study, it was done yesterday and shows some delayed gastric emptying. She has been referred to [facility] for further eval. Her main concern for me is to get short-term disability, she has not been able to walk most days due to her symptoms, the symptoms are unpredictable and according to the patient , her job won't  let her have an unpredictable schedule consequently the solution is to take her off work. Plan: Check a CBC and a TSH Will be seen at [facility] per day local GI referral I'll do my best to get her short-term disability paperwork, we agreed she won't be working for the next 3 weeks  I also talked briefly with her GI about the case, they are actually sending a prescription for Reglan       Relevant Orders   CBC with Differential (Completed)   TSH (Completed)

## 2014-09-18 NOTE — Progress Notes (Signed)
Pre visit review using our clinic review tool, if applicable. No additional management support is needed unless otherwise documented below in the visit note. 

## 2014-09-18 NOTE — Patient Instructions (Signed)
Get your blood work before you leave    Please come back to the office in 4 months  for a routine check up

## 2014-09-18 NOTE — Assessment & Plan Note (Addendum)
Persistent nausea, has seen GI for the last few weeks. Status post a EGD and a gastric emptying study, it was done yesterday and shows some delayed gastric emptying. She has been referred to [facility] for further eval. Her main concern for me is to get short-term disability, she has not been able to walk most days due to her symptoms, the symptoms are unpredictable and according to the patient , her job won't  let her have an unpredictable schedule consequently the solution is to take her off work. Plan: Check a CBC and a TSH Will be seen at [facility] per day local GI referral I'll do my best to get her short-term disability paperwork, we agreed she won't be working for the next 3 weeks  I also talked briefly with her GI about the case, they are actually sending a prescription for Reglan

## 2014-09-19 LAB — TSH: TSH: 0.676 u[IU]/mL (ref 0.350–4.500)

## 2014-09-21 ENCOUNTER — Telehealth: Payer: Self-pay | Admitting: *Deleted

## 2014-09-21 NOTE — Telephone Encounter (Signed)
Received STD benefits paperwork via fax from [employer]. Forms completed as much as possible and forwarded to Dr. Larose Kells. JG//CMA

## 2014-09-22 DIAGNOSIS — Z7689 Persons encountering health services in other specified circumstances: Secondary | ICD-10-CM

## 2014-09-22 NOTE — Telephone Encounter (Signed)
Completed/signed forms received back from Dr. Larose Kells. Faxed forms to [employer] to the provided fax number successfully. Forms sent for scanning.  JG//CMA

## 2014-09-25 ENCOUNTER — Encounter: Payer: Self-pay | Admitting: Internal Medicine

## 2014-09-30 LAB — CBC AND DIFFERENTIAL
HCT: 44 % (ref 36–46)
HEMATOCRIT: 44 % (ref 36–46)
HEMATOCRIT: 44 % (ref 36–46)
Hemoglobin: 14.1 g/dL (ref 12.0–16.0)
Hemoglobin: 14.1 g/dL (ref 12.0–16.0)
Hemoglobin: 14.1 g/dL (ref 12.0–16.0)
PLATELETS: 265 10*3/uL (ref 150–399)
PLATELETS: 265 10*3/uL (ref 150–399)
PLATELETS: 265 10*3/uL (ref 150–399)
WBC: 11.4 10*3/mL
WBC: 11.4 10*3/mL
WBC: 11.4 10^3/mL

## 2014-10-07 ENCOUNTER — Telehealth: Payer: Self-pay | Admitting: Internal Medicine

## 2014-10-07 NOTE — Telephone Encounter (Signed)
Caller name: Kimie, Pidcock Relation to [id number]  Call back number: [phone]   Reason for call:  Dr. Alcide Goodness digestive specialist at [facility] hospital advised pt to stop taking Ritalin 7 days prior to electro gastro grahm which is March 8th at 11am.. Pt states Ritalin is the only thing that calms her anxiety and requesting you extend date to March 11th when she can return to work. Please advise.

## 2014-10-07 NOTE — Telephone Encounter (Signed)
Please advise 

## 2014-10-07 NOTE — Telephone Encounter (Signed)
Please do

## 2014-10-08 NOTE — Telephone Encounter (Signed)
Spoke with Pt informed her letter is ready for pick up at front desk. Pt verbalized understanding.

## 2014-10-08 NOTE — Telephone Encounter (Signed)
Spoke with Pt, informed her that Dr. Larose Kells has agreed to extend medication as requested. She informed me that she needs a letter from Dr. Larose Kells stating this. Informed her I would type the letter and call when ready for pick up. Pt verbalized understanding. Letter printed, awaiting signature by Dr. Larose Kells.

## 2014-10-09 ENCOUNTER — Telehealth: Payer: Self-pay | Admitting: Internal Medicine

## 2014-10-09 NOTE — Telephone Encounter (Signed)
Original message on 10/07/2014 read as requested extension of medication (Ritalin) date, Pt is now wanting medical leave extended to March 11. Tried informing her she will need to contact Dr performing procedure regarding date of release to go back to work. Please advise.

## 2014-10-09 NOTE — Telephone Encounter (Signed)
Prior she  requested a extension of medical leave. Please correct letter

## 2014-10-09 NOTE — Telephone Encounter (Signed)
Caller name:Karen [name] Relationship to patient:Self   Reason for call: Letter that was printed to extend the medical leave states to extend the " medication" not " medical leave" - PT needs letter to be reprinted with " medical leave" used.

## 2014-10-09 NOTE — Telephone Encounter (Signed)
Please see previous phone note 10/07/14. She had requested this earlier and states that Dr. Larose Kells had approved extending her medical leave out until 3/11

## 2014-10-09 NOTE — Telephone Encounter (Signed)
Called PT- Lm to call office for clarification

## 2014-10-09 NOTE — Telephone Encounter (Signed)
Okay, per last telephone note and when I spoke with her on the phone, she stated she needed her medication extended until she returned to work??. If she needs her medical leave extended she will need to discuss that with the provider performing the procedure.

## 2014-10-12 NOTE — Telephone Encounter (Signed)
Letter printed, awaiting signature by Dr. Larose Kells.

## 2014-10-12 NOTE — Telephone Encounter (Signed)
Letter placed at front desk, ready for pick up.

## 2014-10-22 ENCOUNTER — Encounter: Payer: Self-pay | Admitting: Internal Medicine

## 2014-10-26 NOTE — Telephone Encounter (Signed)
Caller name: LORRAINE CIMMINO Relation to pt: self  Call back number: [phone]   Reason for call:  Pt requesting a doctor note stating pt can return to work on 10/26/14 for 1/2 days starting this week please fax to [phone]

## 2014-10-27 NOTE — Telephone Encounter (Signed)
Letter printed and faxed to number provided: [phone].

## 2014-10-27 NOTE — Telephone Encounter (Signed)
Patient calling in regarding this. She states that she has emailed Dr. Larose Kells regarding this and needs this asap.

## 2014-10-27 NOTE — Telephone Encounter (Signed)
Received fax confirmation on 10/27/2013 at 11:29.

## 2014-11-03 ENCOUNTER — Encounter: Payer: Self-pay | Admitting: Internal Medicine

## 2014-11-05 ENCOUNTER — Telehealth: Payer: Self-pay | Admitting: Internal Medicine

## 2014-11-05 NOTE — Telephone Encounter (Signed)
Letter faxed to number [phone].

## 2014-11-05 NOTE — Telephone Encounter (Signed)
Caller name: maddison Relation to pt: self Call back number: [phone] Pharmacy:  Reason for call:   Per 11/03/14 mychart message. Please fax work note to [phone]

## 2014-11-05 NOTE — Telephone Encounter (Signed)
Letter printed.

## 2014-11-10 ENCOUNTER — Telehealth: Payer: Self-pay | Admitting: Internal Medicine

## 2014-11-10 NOTE — Telephone Encounter (Signed)
Caller name:Citro, Cagney Relation to VF:[id number] Call back [phone] Pharmacy:  Reason for call: pt would like to get confirmation if you received her short term disability forms that were faxed on 11/02/14, states if she does not answer leave her a message letting her know if she needs to contact [facility] again and have them resend it.

## 2014-11-10 NOTE — Telephone Encounter (Signed)
Called and l/m for pt informing her that forms have not been received. I provided an alternative fax number for them to use if necessary. JG//CMA

## 2014-11-11 NOTE — Telephone Encounter (Signed)
Forms received via fax today and completed as much as possible. Forwarded to provider. JG//CMA

## 2014-11-12 NOTE — Telephone Encounter (Signed)
Paperwork faxed to [employer] successfully. JG//CMA

## 2014-11-18 ENCOUNTER — Other Ambulatory Visit: Payer: Self-pay

## 2014-12-16 ENCOUNTER — Other Ambulatory Visit: Payer: BC Managed Care – PPO

## 2014-12-17 ENCOUNTER — Other Ambulatory Visit: Payer: BLUE CROSS/BLUE SHIELD

## 2014-12-18 ENCOUNTER — Encounter: Payer: Self-pay | Admitting: Internal Medicine

## 2014-12-18 ENCOUNTER — Other Ambulatory Visit: Payer: Self-pay

## 2014-12-18 ENCOUNTER — Ambulatory Visit (INDEPENDENT_AMBULATORY_CARE_PROVIDER_SITE_OTHER): Payer: BLUE CROSS/BLUE SHIELD | Admitting: Internal Medicine

## 2014-12-18 VITALS — BP 120/78 | HR 82 | Temp 98.3°F | Ht 63.0 in | Wt 156.1 lb

## 2014-12-18 DIAGNOSIS — D72829 Elevated white blood cell count, unspecified: Secondary | ICD-10-CM | POA: Diagnosis not present

## 2014-12-18 DIAGNOSIS — K3184 Gastroparesis: Secondary | ICD-10-CM

## 2014-12-18 LAB — CBC WITH DIFFERENTIAL/PLATELET
BASOS PCT: 0.4 % (ref 0.0–3.0)
Basophils Absolute: 0 10*3/uL (ref 0.0–0.1)
EOS ABS: 0 10*3/uL (ref 0.0–0.7)
Eosinophils Relative: 0.3 % (ref 0.0–5.0)
HEMATOCRIT: 42.7 % (ref 36.0–46.0)
HEMOGLOBIN: 14.4 g/dL (ref 12.0–15.0)
LYMPHS ABS: 2.7 10*3/uL (ref 0.7–4.0)
Lymphocytes Relative: 23.2 % (ref 12.0–46.0)
MCHC: 33.6 g/dL (ref 30.0–36.0)
MCV: 79.7 fl (ref 78.0–100.0)
MONO ABS: 0.8 10*3/uL (ref 0.1–1.0)
Monocytes Relative: 6.7 % (ref 3.0–12.0)
NEUTROS ABS: 8.2 10*3/uL — AB (ref 1.4–7.7)
Neutrophils Relative %: 69.4 % (ref 43.0–77.0)
Platelets: 249 10*3/uL (ref 150.0–400.0)
RBC: 5.36 Mil/uL — ABNORMAL HIGH (ref 3.87–5.11)
RDW: 13.9 % (ref 11.5–15.5)
WBC: 11.8 10*3/uL — ABNORMAL HIGH (ref 4.0–10.5)

## 2014-12-18 NOTE — Progress Notes (Signed)
Subjective:    Patient ID: Karen [name], female    DOB: July 20, 1971, 44 y.o.   MRN: [id number]  DOS:  12/18/2014 Type of visit - description : acute Interval history: She has a history of nausea and gastroparesis and was doing well until recently. The week of 12/07/2014  3 family members got affected with headache, nausea. All got better within 2 days. Her symptoms started approximately 12/08/2014 however in her case the headache and nausea-vomiting are persistent. Headache is mild to moderate, on and off throughout the day, frontal. She has not been able to eat too many solids but is drinking plenty of fluids, she vomited mostly acid fluids.    Review of Systems Denies fever chills No diarrhea or abdominal pain. No hematemesis. No phono or photophobia   Past Medical History  Diagnosis Date  . Thyroid nodule   . Seasonal allergies   . GERD (gastroesophageal reflux disease)   . Hypothyroidism   . PONV (postoperative nausea and vomiting)   . SVD (spontaneous vaginal delivery)     x 1    Past Surgical History  Procedure Laterality Date  . Thyroidectomy, partial    . Cholecystectomy    . Wisdom tooth extraction    . Eye surgery      lasik  bilateral  . Laparoscopic assisted vaginal hysterectomy  09/12/2012    Procedure: LAPAROSCOPIC ASSISTED VAGINAL HYSTERECTOMY;  Surgeon: Luz Lex, MD;  Location: [facility];  Service: Gynecology;  Laterality: N/A;    History   Social History  . Marital Status: Married    Spouse Name: N/A  . Number of Children: 2  . Years of Education: N/A   Occupational History  . [employer]    Social History Main Topics  . Smoking status: Never Smoker   . Smokeless tobacco: Never Used     Comment: never smoked  . Alcohol Use: No  . Drug Use: No  . Sexual Activity: Yes    Birth Control/ Protection: Pill   Other Topics Concern  . Not on file   Social History Narrative        Medication List       This list is accurate as  of: 12/18/14 11:59 PM.  Always use your most recent med list.               cholestyramine 4 G packet  Commonly known as:  QUESTRAN  Take 4 g by mouth 2 (two) times daily.     diphenoxylate-atropine 2.5-0.025 MG per tablet  Commonly known as:  LOMOTIL  Take 1 tablet by mouth 4 (four) times daily as needed for diarrhea or loose stools.     FLORASTOR PO  Take 2 tablets by mouth daily.     LEVOTHROID 25 MCG tablet  Generic drug:  levothyroxine  Take 25 mcg by mouth daily.     metoCLOPramide 5 MG tablet  Commonly known as:  REGLAN  Take 5 mg by mouth 3 (three) times daily before meals.     multivitamin with minerals Tabs tablet  Take 1 tablet by mouth daily.     pantoprazole 20 MG tablet  Commonly known as:  PROTONIX  Take 20 mg by mouth daily.     promethazine-dextromethorphan 6.25-15 MG/5ML syrup  Commonly known as:  PROMETHAZINE-DM  Take 5 mLs by mouth 4 (four) times daily as needed.     sucralfate 1 G tablet  Commonly known as:  CARAFATE  Take 1 g by  mouth 2 (two) times daily.     ZOFRAN PO  Take by mouth. AS NEEDED FOR NAUSEA AND/OR VOMITING           Objective:   Physical Exam BP 120/78 mmHg  Pulse 82  Temp(Src) 98.3 F (36.8 C) (Oral)  Ht 5\' 3"  (1.6 m)  Wt 156 lb 2 oz (70.818 kg)  BMI 27.66 kg/m2  SpO2 97%  LMP 08/08/2012 General:   Well developed, well nourished   HEENT:  Normocephalic . Face symmetric, atraumatic Lungs:  CTA B Normal respiratory effort, no intercostal retractions, no accessory muscle use. Heart: RRR,  no murmur.  no pretibial edema bilaterally  Abdomen:  Not distended, soft, non-tender. No rebound or rigidity. No mass,organomegaly Skin: Not pale. Not jaundice Neurologic:  alert & oriented X3.  Speech normal, gait appropriate for age and unassisted No nuchal rigidity Psych--  Cognition and judgment appear intact.  Cooperative with normal attention span and concentration.  Behavior appropriate. Tearful "I am so frustrated  with the nausea"     Assessment & Plan:    Today , I spent more than  25  min with the patient: >50% of the time counseling regards her symptoms, providing emotional support, she is really frustrated  by her symptoms.

## 2014-12-18 NOTE — Patient Instructions (Signed)
Labs before you go Rest-fluids-tylenol Continue same meds Call if no better in 1 week

## 2014-12-18 NOTE — Assessment & Plan Note (Addendum)
the current diagnosis of her symptoms of persistent nausea and vomiting is gastroparesis. [facility] note reviewed, was seen 09/2014 They noted that her symptoms were likely due to gastroparesis and she was better on Reglan. They plan to check a cortisol level, a electrogastrogram and refer to Dr. Derrill Kay for consideration of EGD and evaluation of the pyloro. She also was noted to have IBS which was at the time well-controlled The patient reports had a electrogastrogram and was told it was okay,  a EGD was not performed.   Presents today with a exacerbation of her nausea and vomiting along with headaches in the context of other fam members affected with similar symptoms. Neurological exam is normal, abdominal exam is benign. Probably she had a virus that is exacerbating her chronic gastroparesis; headache may be related to a virus as well. Plan: Rest, fluids, Tylenol for headache, continue taking  nausea medication. She has been unable to work and hopefully she will be able to go back to work next week. Note provided from 4-26 to 5- 11. Patient encouraged to call me if she is not better particularly if she continue with headaches, will need a CT of the brain.

## 2014-12-18 NOTE — Assessment & Plan Note (Signed)
Persistent leukocytosis, recheck today, if WBCs continue elevated will refer to hematology

## 2014-12-18 NOTE — Progress Notes (Signed)
Pre visit review using our clinic review tool, if applicable. No additional management support is needed unless otherwise documented below in the visit note. 

## 2014-12-22 ENCOUNTER — Ambulatory Visit
Admission: RE | Admit: 2014-12-22 | Discharge: 2014-12-22 | Disposition: A | Discharge status: Home / Self Care | Payer: BLUE CROSS/BLUE SHIELD | Source: Ambulatory Visit | Attending: Endocrinology | Admitting: Endocrinology

## 2014-12-22 DIAGNOSIS — E041 Nontoxic single thyroid nodule: Secondary | ICD-10-CM

## 2014-12-23 ENCOUNTER — Encounter: Payer: Self-pay | Admitting: Internal Medicine

## 2014-12-23 NOTE — Telephone Encounter (Signed)
Work note written from December 08, 2014 to Dec 28, 2014. Letter released to MyChart as recommended.

## 2014-12-23 NOTE — Addendum Note (Signed)
Addended by: Wilfrid Lund on: 12/23/2014 08:19 AM   Modules accepted: Orders

## 2014-12-23 NOTE — Telephone Encounter (Signed)
Patient called in regarding this stating that there isn't a form for her to fax. Is requesting that a letter be sent to her through [employer]. [employer] # [phone]

## 2014-12-30 ENCOUNTER — Telehealth: Payer: Self-pay | Admitting: *Deleted

## 2014-12-30 NOTE — Telephone Encounter (Signed)
Received attending physician's statement via fax from [place] to extend leave until 12/28/14. Forms filled out as much as possible and forwarded to Dr. Larose Kells. JG//CMA

## 2014-12-31 ENCOUNTER — Other Ambulatory Visit: Payer: Self-pay | Admitting: Endocrinology

## 2014-12-31 ENCOUNTER — Telehealth: Payer: Self-pay | Admitting: Hematology & Oncology

## 2014-12-31 DIAGNOSIS — E041 Nontoxic single thyroid nodule: Secondary | ICD-10-CM

## 2014-12-31 NOTE — Telephone Encounter (Signed)
I spoke w NEW PATIENT today to remind them of their appointment with Dr. Ennever. Also, advised them to bring all medication bottles and insurance card information. ° °

## 2014-12-31 NOTE — Telephone Encounter (Signed)
Needs leave extended to 01/12/15, per patient per Dr Larose Kells

## 2015-01-01 ENCOUNTER — Other Ambulatory Visit: Payer: Self-pay | Admitting: Endocrinology

## 2015-01-01 DIAGNOSIS — Z7689 Persons encountering health services in other specified circumstances: Secondary | ICD-10-CM

## 2015-01-01 DIAGNOSIS — E041 Nontoxic single thyroid nodule: Secondary | ICD-10-CM

## 2015-01-04 NOTE — Telephone Encounter (Signed)
Faxed to [facility] successfully. Pt copy placed up front. Copy sent for scanning. JG//CMA

## 2015-01-13 ENCOUNTER — Encounter: Payer: Self-pay | Admitting: Internal Medicine

## 2015-01-18 ENCOUNTER — Ambulatory Visit (INDEPENDENT_AMBULATORY_CARE_PROVIDER_SITE_OTHER): Payer: BLUE CROSS/BLUE SHIELD | Admitting: Internal Medicine

## 2015-01-18 ENCOUNTER — Encounter: Payer: Self-pay | Admitting: Internal Medicine

## 2015-01-18 ENCOUNTER — Other Ambulatory Visit: Payer: Self-pay

## 2015-01-18 VITALS — BP 124/78 | HR 83 | Temp 97.9°F | Ht 64.0 in | Wt 158.4 lb

## 2015-01-18 DIAGNOSIS — E039 Hypothyroidism, unspecified: Secondary | ICD-10-CM | POA: Diagnosis not present

## 2015-01-18 DIAGNOSIS — K3184 Gastroparesis: Secondary | ICD-10-CM

## 2015-01-18 NOTE — Assessment & Plan Note (Addendum)
History of gastroparesis documented by a gastric emptying study 09-2014, needs extensive paper work. I went ahead and provide a work excuse, see letter. Will also fill her  FMLA. I strongly recommend her to get this paperwork from her gastroenterologist as they are managing her case, particulalrly if she needs prolonged absence from work.  Today , I spent more than  20  min with the patient: >50% of the time counseling regards her symptoms, listening to her, coordinating her care

## 2015-01-18 NOTE — Assessment & Plan Note (Signed)
Follow-up by endocrinology, she also has a thyroid nodule, it recently increase in size, and biopsy is pending

## 2015-01-18 NOTE — Progress Notes (Signed)
Subjective:    Patient ID: Karen [name], female    DOB: 11-27-1970, 44 y.o.   MRN: [id number]  DOS:  01/18/2015 Type of visit - description : rov Interval history: Continue with gastroparesis sx, having a hard time going to work. Also, recently her thyroid increased in size, she was recommended a biopsy. Hematology follow-up pending   Review of Systems    Past Medical History  Diagnosis Date  . Thyroid nodule   . Seasonal allergies   . GERD (gastroesophageal reflux disease)   . Hypothyroidism   . PONV (postoperative nausea and vomiting)   . SVD (spontaneous vaginal delivery)     x 1  . Chronic nausea   . Gastroparesis     Dr. Jerene Pitch  . IBS (irritable bowel syndrome)     Dr. Jerene Pitch    Past Surgical History  Procedure Laterality Date  . Thyroidectomy, partial    . Cholecystectomy    . Wisdom tooth extraction    . Eye surgery      lasik  bilateral  . Laparoscopic assisted vaginal hysterectomy  09/12/2012    Procedure: LAPAROSCOPIC ASSISTED VAGINAL HYSTERECTOMY;  Surgeon: Luz Lex, MD;  Location: [facility];  Service: Gynecology;  Laterality: N/A;    History   Social History  . Marital Status: Married    Spouse Name: N/A  . Number of Children: 2  . Years of Education: N/A   Occupational History  . [employer]    Social History Main Topics  . Smoking status: Never Smoker   . Smokeless tobacco: Never Used     Comment: never smoked  . Alcohol Use: No  . Drug Use: No  . Sexual Activity: Yes    Birth Control/ Protection: Pill   Other Topics Concern  . Not on file   Social History Narrative        Medication List       This list is accurate as of: 01/18/15 11:59 PM.  Always use your most recent med list.               cholestyramine 4 G packet  Commonly known as:  QUESTRAN  Take 4 g by mouth 2 (two) times daily.     dicyclomine 10 MG capsule  Commonly known as:  BENTYL  Take 10 mg by mouth 2 (two) times daily as needed for spasms.       diphenoxylate-atropine 2.5-0.025 MG per tablet  Commonly known as:  LOMOTIL  Take 1 tablet by mouth 4 (four) times daily as needed for diarrhea or loose stools.     FLORASTOR PO  Take 2 tablets by mouth daily.     LEVOTHROID 25 MCG tablet  Generic drug:  levothyroxine  Take 25 mcg by mouth daily.     metoCLOPramide 5 MG tablet  Commonly known as:  REGLAN  Take 5 mg by mouth 3 (three) times daily before meals.     multivitamin with minerals Tabs tablet  Take 1 tablet by mouth daily.     pantoprazole 20 MG tablet  Commonly known as:  PROTONIX  Take 20 mg by mouth daily.     promethazine-dextromethorphan 6.25-15 MG/5ML syrup  Commonly known as:  PROMETHAZINE-DM  Take 5 mLs by mouth 4 (four) times daily as needed.     sucralfate 1 G tablet  Commonly known as:  CARAFATE  Take 1 g by mouth 2 (two) times daily.     UNABLE TO FIND  Take  1 capsule by mouth 2 (two) times daily. Med Name: IB Guard OTC     ZOFRAN PO  Take by mouth. AS NEEDED FOR NAUSEA AND/OR VOMITING           Objective:   Physical Exam BP 124/78 mmHg  Pulse 83  Temp(Src) 97.9 F (36.6 C) (Oral)  Ht 5\' 4"  (1.626 m)  Wt 158 lb 6 oz (71.838 kg)  BMI 27.17 kg/m2  SpO2 98%  LMP 08/08/2012 General:   Well developed, well nourished . NAD.  HEENT:  Normocephalic . Face symmetric, atraumatic Skin: Not pale. Not jaundice Neurologic:  alert & oriented X3.  Speech normal, gait appropriate for age and unassisted Psych--  Cognition and judgment appear intact.  Cooperative with normal attention span and concentration.  Behavior appropriate. Mildly anxious , no  depressed appearing.     Assessment & Plan:

## 2015-01-18 NOTE — Progress Notes (Signed)
Pre visit review using our clinic review tool, if applicable. No additional management support is needed unless otherwise documented below in the visit note. 

## 2015-01-20 ENCOUNTER — Other Ambulatory Visit (HOSPITAL_COMMUNITY)
Admission: RE | Admit: 2015-01-20 | Discharge: 2015-01-20 | Disposition: A | Discharge status: Home / Self Care | Payer: BLUE CROSS/BLUE SHIELD | Source: Ambulatory Visit | Attending: Interventional Radiology | Admitting: Interventional Radiology

## 2015-01-20 ENCOUNTER — Ambulatory Visit
Admission: RE | Admit: 2015-01-20 | Discharge: 2015-01-20 | Disposition: A | Discharge status: Home / Self Care | Payer: BLUE CROSS/BLUE SHIELD | Source: Ambulatory Visit | Attending: Endocrinology | Admitting: Endocrinology

## 2015-01-20 DIAGNOSIS — E041 Nontoxic single thyroid nodule: Secondary | ICD-10-CM | POA: Insufficient documentation

## 2015-01-22 ENCOUNTER — Telehealth: Payer: Self-pay | Admitting: *Deleted

## 2015-01-22 NOTE — Telephone Encounter (Signed)
FMLA paperwork received via fax from [employer]. Filled out as much as possible and forwarded to Dr. Larose Kells. JG//CMA

## 2015-01-25 NOTE — Telephone Encounter (Signed)
As noted below, forms are currently with Dr. Larose Kells. JG//CMA

## 2015-01-25 NOTE — Telephone Encounter (Signed)
Caller name: Keaira Whitehurst Relationship to patient: self Can be reached: [phone] Pharmacy:  Reason for call: Pt following up on this. She is wanting a copy or the original. Please notify of status.

## 2015-01-26 ENCOUNTER — Encounter: Payer: Self-pay | Admitting: Family

## 2015-01-26 ENCOUNTER — Other Ambulatory Visit (HOSPITAL_BASED_OUTPATIENT_CLINIC_OR_DEPARTMENT_OTHER): Payer: BLUE CROSS/BLUE SHIELD

## 2015-01-26 ENCOUNTER — Ambulatory Visit: Payer: BLUE CROSS/BLUE SHIELD

## 2015-01-26 ENCOUNTER — Ambulatory Visit (HOSPITAL_BASED_OUTPATIENT_CLINIC_OR_DEPARTMENT_OTHER): Payer: BLUE CROSS/BLUE SHIELD | Admitting: Family

## 2015-01-26 VITALS — BP 135/86 | HR 82 | Temp 98.0°F | Resp 16 | Ht 62.5 in | Wt 160.0 lb

## 2015-01-26 DIAGNOSIS — D72829 Elevated white blood cell count, unspecified: Secondary | ICD-10-CM

## 2015-01-26 LAB — CBC WITH DIFFERENTIAL (CANCER CENTER ONLY)
BASO#: 0 10*3/uL (ref 0.0–0.2)
BASO%: 0.3 % (ref 0.0–2.0)
EOS ABS: 0.1 10*3/uL (ref 0.0–0.5)
EOS%: 1.1 % (ref 0.0–7.0)
HCT: 39 % (ref 34.8–46.6)
HEMOGLOBIN: 13.4 g/dL (ref 11.6–15.9)
LYMPH#: 2.5 10*3/uL (ref 0.9–3.3)
LYMPH%: 33.8 % (ref 14.0–48.0)
MCH: 28 pg (ref 26.0–34.0)
MCHC: 34.4 g/dL (ref 32.0–36.0)
MCV: 82 fL (ref 81–101)
MONO#: 0.6 10*3/uL (ref 0.1–0.9)
MONO%: 8.8 % (ref 0.0–13.0)
NEUT#: 4.1 10*3/uL (ref 1.5–6.5)
NEUT%: 56 % (ref 39.6–80.0)
PLATELETS: 249 10*3/uL (ref 145–400)
RBC: 4.78 10*6/uL (ref 3.70–5.32)
RDW: 13.8 % (ref 11.1–15.7)
WBC: 7.3 10*3/uL (ref 3.9–10.0)

## 2015-01-26 LAB — CHCC SATELLITE - SMEAR

## 2015-01-26 NOTE — Progress Notes (Signed)
Hematology/Oncology Consultation   Name: Karen [name]      MRN: [id number]    Location: Room/bed info not found  Date: 01/26/2015 Time:9:58 AM   REFERRING PHYSICIAN: Colon Branch, MD  REASON FOR CONSULT: Leukocytosis   DIAGNOSIS: Leukocytosis - reactive secondary to gastroparesis   HISTORY OF PRESENT ILLNESS: Ms. Karen [name] is a very pleasant 44 yo white female with a history of elevated WBC counts. Today her white cells are down to 7.3.  She has had frequent bouts with gastroparesis for a while now. She has started on Protonix and Reglan but still has flares.  She has had some diarrhea without blood in her stool.  No fever, chills, n/v, cough, rash, dizziness, SOB, chest pain, palpitations, abdominal pain ,constipation, problems urinating, blood in urine.  She has had partial thyroidectomy with the left side having been removed. She has a nodule that was biopsied on 01/20/15 and was found to be benign.  She has no family history of leukocytosis or other abnormalities with the blood.  Her mother, maternal grandmother and maternal great grandmother have all had breast cancer. No genetic testing has been done that she knows of.  She is due for a mammogram. She had one scheduled but had a gastroparesis flare and was unable to go. She plans to reschedule today.  She had an EGD in January where she was found to have polyps. Biopsy confirmed they were benign.  She states that her last colonoscopy was " a couple years ago and was fine." She has had no swelling, tenderness, numbness or tingling in her extremities. No new aches or pains.  Her appetite is good and she is staying hydrated. No significant weight loss or gain.  She has 2 daughters both of whom are healthy. No miscarriages.  She had a partial hysterectomy in 2014. She does not smoke or drink alcohol.  She works part time for [employer].  ROS: All other 10 point review of systems is negative.   PAST MEDICAL HISTORY:   Past Medical History   Diagnosis Date  . Thyroid nodule   . Seasonal allergies   . GERD (gastroesophageal reflux disease)   . Hypothyroidism   . PONV (postoperative nausea and vomiting)   . SVD (spontaneous vaginal delivery)     x 1  . Chronic nausea   . Gastroparesis     Dr. Jerene Pitch  . IBS (irritable bowel syndrome)     Dr. Jerene Pitch    ALLERGIES: Allergies  Allergen Reactions  . Hydrocortisone Rash    Water blisters when cream applied      MEDICATIONS:  Current Outpatient Prescriptions on File Prior to Visit  Medication Sig Dispense Refill  . cholestyramine (QUESTRAN) 4 G packet Take 4 g by mouth 2 (two) times daily.    Marland Kitchen dicyclomine (BENTYL) 10 MG capsule Take 10 mg by mouth 2 (two) times daily as needed for spasms.    Marland Kitchen levothyroxine (LEVOTHROID) 25 MCG tablet Take 25 mcg by mouth daily.    . metoCLOPramide (REGLAN) 5 MG tablet Take 5 mg by mouth 3 (three) times daily before meals.    . Multiple Vitamin (MULTIVITAMIN WITH MINERALS) TABS Take 1 tablet by mouth daily.    . Ondansetron HCl (ZOFRAN PO) Take by mouth. AS NEEDED FOR NAUSEA AND/OR VOMITING    . pantoprazole (PROTONIX) 20 MG tablet Take 20 mg by mouth daily.    . Saccharomyces boulardii (FLORASTOR PO) Take 2 tablets by mouth daily.    Marland Kitchen  UNABLE TO FIND Take 1 capsule by mouth 2 (two) times daily. Med Name: IB Guard OTC     No current facility-administered medications on file prior to visit.     PAST SURGICAL HISTORY Past Surgical History  Procedure Laterality Date  . Thyroidectomy, partial    . Cholecystectomy    . Wisdom tooth extraction    . Eye surgery      lasik  bilateral  . Laparoscopic assisted vaginal hysterectomy  09/12/2012    Procedure: LAPAROSCOPIC ASSISTED VAGINAL HYSTERECTOMY;  Surgeon: Luz Lex, MD;  Location: [facility];  Service: Gynecology;  Laterality: N/A;    FAMILY HISTORY: History reviewed. No pertinent family history.  SOCIAL HISTORY:  reports that she has never smoked. She has never used smokeless  tobacco. She reports that she does not drink alcohol or use illicit drugs.  PERFORMANCE STATUS: The patient's performance status is 0 - Asymptomatic  PHYSICAL EXAM: Most Recent Vital Signs: Blood pressure 135/86, pulse 82, temperature 98 F (36.7 C), temperature source Oral, resp. rate 16, height 5' 2.5" (1.588 m), weight 160 lb (72.576 kg), last menstrual period 08/08/2012. BP 135/86 mmHg  Pulse 82  Temp(Src) 98 F (36.7 C) (Oral)  Resp 16  Ht 5' 2.5" (1.588 m)  Wt 160 lb (72.576 kg)  BMI 28.78 kg/m2  LMP 08/08/2012  General Appearance:    Alert, cooperative, no distress, appears stated age  Head:    Normocephalic, without obvious abnormality, atraumatic  Eyes:    PERRL, conjunctiva/corneas clear, EOM's intact, fundi    benign, both eyes  Ears:    Normal TM's and external ear canals, both ears  Nose:   Nares normal, septum midline, mucosa normal, no drainage    or sinus tenderness  Throat:   Lips, mucosa, and tongue normal; teeth and gums normal  Neck:   Supple, symmetrical, trachea midline, no adenopathy;    thyroid:  no enlargement/tenderness/nodules; no carotid   bruit or JVD  Back:     Symmetric, no curvature, ROM normal, no CVA tenderness  Lungs:     Clear to auscultation bilaterally, respirations unlabored  Chest Wall:    No tenderness or deformity   Heart:    Regular rate and rhythm, S1 and S2 normal, no murmur, rub   or gallop  Breast Exam:    No tenderness, masses, or nipple abnormality  Abdomen:     Soft, non-tender, bowel sounds active all four quadrants,    no masses, no organomegaly  Genitalia:    Normal female without lesion, discharge or tenderness  Rectal:    Normal tone, normal prostate, no masses or tenderness;   guaiac negative stool  Extremities:   Extremities normal, atraumatic, no cyanosis or edema  Pulses:   2+ and symmetric all extremities  Skin:   Skin color, texture, turgor normal, no rashes or lesions  Lymph nodes:   Cervical, supraclavicular, and  axillary nodes normal  Neurologic:   CNII-XII intact, normal strength, sensation and reflexes    throughout   LABORATORY DATA:  Results for orders placed or performed in visit on 01/26/15 (from the past 48 hour(s))  [facility] - Smear     Status: None   Collection Time: 01/26/15  9:09 AM  Result Value Ref Range   Smear Result Smear Available   CBC with Differential ([facility])     Status: None   Collection Time: 01/26/15  9:09 AM  Result Value Ref Range   WBC 7.3 3.9 - 10.0  10e3/uL   RBC 4.78 3.70 - 5.32 10e6/uL   HGB 13.4 11.6 - 15.9 g/dL   HCT 39.0 34.8 - 46.6 %   MCV 82 81 - 101 fL   MCH 28.0 26.0 - 34.0 pg   MCHC 34.4 32.0 - 36.0 g/dL   RDW 13.8 11.1 - 15.7 %   Platelets 249 145 - 400 10e3/uL   NEUT# 4.1 1.5 - 6.5 10e3/uL   LYMPH# 2.5 0.9 - 3.3 10e3/uL   MONO# 0.6 0.1 - 0.9 10e3/uL   Eosinophils Absolute 0.1 0.0 - 0.5 10e3/uL   BASO# 0.0 0.0 - 0.2 10e3/uL   NEUT% 56.0 39.6 - 80.0 %   LYMPH% 33.8 14.0 - 48.0 %   MONO% 8.8 0.0 - 13.0 %   EOS% 1.1 0.0 - 7.0 %   BASO% 0.3 0.0 - 2.0 %      RADIOGRAPHY: No results found.     PATHOLOGY: none  ASSESSMENT/PLAN: Ms. Worthey is a very pleasant 45 yo white female with a history of elevated WBC counts. Today her white cells are down to 7.3. She is asymptomatic at this time.  She is not anemic and her differential is within normal limits. Dr. Marin Olp did view her blood smear and found no abnormality or evidence of malignancy.   She has had frequent bouts with gastroparesis for a while now and is followed by GI. She is currently on Protonix and Reglan but still has flares.  Her periodically elevated WBC count is likely reactive, secondary to the gastroparesis. The rest of her exam was negative.  We do not need to have her come back into the office. She is welcome to follow-up with [place] for any future hematologic issues.  All questions were answered. She was discussed with and also seen by Dr. Marin Olp and he is in agreement  with the aforementioned.   [facility]   ADDENDUM:  I saw and examined the patient with Cherylin Waguespack. We looked at the blood smear. She had a normochromic normocytic population of red blood cells. She had no nucleated red blood cells. There were no teardrop cells. There is no schistocytes. She had no target cells. There is no rouleau formation. Her white cells appeared mature. There were no hypersegmented polys. There were no immature myeloid forms. She had no atypical lymphocytes. Platelets were adequate in number and size.  Her exam is previous unrevealing. There is no lymphadenopathy. There is no splenomegaly.  I just don't see that she has any primary bone marrow disorder. I think the leukocytosis, which is transient, is reactive.  We reassured her. We went over the lab work with her. We told her that there really was no indication for any additional studies, specifically a bone marrow biopsy.  As nicer she is, I just don't think we have to get her back to the clinic. I just don't think we will be adding to her medical care. She is receiving outstanding medical care already.  We spent about 30-35 minutes with her today.  Lum Keas

## 2015-02-01 ENCOUNTER — Telehealth: Payer: Self-pay | Admitting: Internal Medicine

## 2015-02-01 NOTE — Telephone Encounter (Signed)
Caller name: Shanavia Relation to pt: Call back number: [phone] Pharmacy:  Reason for call:   Patient states that she gave Dr. Larose Kells paperwork to fill out on 01/18/15 and wanted to know if this was ready for pickup?

## 2015-02-01 NOTE — Telephone Encounter (Signed)
Please advise 

## 2015-02-02 NOTE — Telephone Encounter (Signed)
To be completed today

## 2015-02-02 NOTE — Telephone Encounter (Signed)
Completed forms along with OV notes faxed to [facility] successfully. JG//CMA

## 2015-02-05 NOTE — Telephone Encounter (Signed)
Completed forms faxed 02/03/15

## 2015-02-24 NOTE — Telephone Encounter (Signed)
Caller name: Lilia Pro with [employer] Relationship to patient: Can be reached: [phone] ext [phone]   Reason for call: She refaxed FMLA forms today stating that updates are needed. Please call her to confirm we received the forms.

## 2015-02-25 NOTE — Telephone Encounter (Signed)
Received, completed as much as possible, forwarded to Dr. Larose Kells. JG//CMA

## 2015-02-26 NOTE — Telephone Encounter (Signed)
Completed forms faxed successfully to [employer] at [phone]. JG//CMA

## 2015-04-20 ENCOUNTER — Telehealth: Payer: Self-pay | Admitting: *Deleted

## 2015-04-20 NOTE — Telephone Encounter (Signed)
Received STD questionnaire via fax from [place]. Forwarded to Dr. Larose Kells. JG//CMA

## 2015-04-21 NOTE — Telephone Encounter (Signed)
Caller name: Romelle with [employer] Relationship to patient: Can be reached: [phone] Pharmacy:  Reason for call: Needs call back regarding questionnaire that was faxed in yesterday

## 2015-04-23 NOTE — Telephone Encounter (Signed)
I have not seen any paperwork regarding this Pt, will defer to Lamount Cohen for when she returns to the office on Monday.

## 2015-04-23 NOTE — Telephone Encounter (Signed)
Caller name: Romelle with [employer] - 2nd call Relationship to patient:  Can be reached: [phone] x [phone]  Pharmacy:  Reason for call: Needs call back regarding questionnaire that was faxed in 04/20/15

## 2015-04-26 NOTE — Telephone Encounter (Signed)
This form is with Dr. Larose Kells. Please see below to the original phone note.

## 2015-04-27 NOTE — Telephone Encounter (Signed)
Form completed, they asked if the restrictions and limitations were based on subjective symptoms only, the answer is no, she had a gastric emptying study 09-2014 that showed gastroparesis.

## 2015-04-27 NOTE — Telephone Encounter (Signed)
Forms faxed successfully to [employer] [facility].

## 2015-09-03 ENCOUNTER — Other Ambulatory Visit: Payer: Self-pay | Admitting: Obstetrics and Gynecology

## 2015-09-03 DIAGNOSIS — Z803 Family history of malignant neoplasm of breast: Secondary | ICD-10-CM

## 2015-09-24 ENCOUNTER — Ambulatory Visit
Admission: RE | Admit: 2015-09-24 | Discharge: 2015-09-24 | Disposition: A | Discharge status: Home / Self Care | Payer: BLUE CROSS/BLUE SHIELD | Source: Ambulatory Visit | Attending: Obstetrics and Gynecology | Admitting: Obstetrics and Gynecology

## 2015-09-24 DIAGNOSIS — Z803 Family history of malignant neoplasm of breast: Secondary | ICD-10-CM

## 2015-09-24 MED ORDER — GADOBENATE DIMEGLUMINE 529 MG/ML IV SOLN
15.0000 mL | Freq: Once | INTRAVENOUS | Status: AC | PRN
  Administered 2015-09-24: 15 mL via INTRAVENOUS

## 2016-03-10 ENCOUNTER — Telehealth: Payer: BLUE CROSS/BLUE SHIELD | Admitting: Family

## 2016-03-10 DIAGNOSIS — J208 Acute bronchitis due to other specified organisms: Secondary | ICD-10-CM

## 2016-03-10 MED ORDER — BENZONATATE 100 MG PO CAPS: 100.0000 mg | ORAL_CAPSULE | Freq: Two times a day (BID) | ORAL | 0 refills | Status: DC | PRN

## 2016-03-10 NOTE — Progress Notes (Signed)

## 2016-11-17 ENCOUNTER — Other Ambulatory Visit: Payer: Self-pay | Admitting: Endocrinology

## 2016-11-17 DIAGNOSIS — E041 Nontoxic single thyroid nodule: Secondary | ICD-10-CM

## 2016-12-13 ENCOUNTER — Other Ambulatory Visit: Payer: Self-pay | Admitting: Obstetrics and Gynecology

## 2016-12-13 DIAGNOSIS — R928 Other abnormal and inconclusive findings on diagnostic imaging of breast: Secondary | ICD-10-CM

## 2016-12-15 ENCOUNTER — Ambulatory Visit
Admission: RE | Admit: 2016-12-15 | Discharge: 2016-12-15 | Disposition: A | Discharge status: Home / Self Care | Payer: BLUE CROSS/BLUE SHIELD | Source: Ambulatory Visit | Attending: Obstetrics and Gynecology | Admitting: Obstetrics and Gynecology

## 2016-12-15 ENCOUNTER — Ambulatory Visit
Admission: RE | Admit: 2016-12-15 | Discharge: 2016-12-15 | Disposition: A | Discharge status: Home / Self Care | Payer: Managed Care, Other (non HMO) | Source: Ambulatory Visit | Attending: Obstetrics and Gynecology | Admitting: Obstetrics and Gynecology

## 2016-12-15 DIAGNOSIS — R928 Other abnormal and inconclusive findings on diagnostic imaging of breast: Secondary | ICD-10-CM

## 2016-12-28 ENCOUNTER — Other Ambulatory Visit: Payer: BLUE CROSS/BLUE SHIELD

## 2017-01-01 ENCOUNTER — Ambulatory Visit
Admission: RE | Admit: 2017-01-01 | Discharge: 2017-01-01 | Disposition: A | Discharge status: Home / Self Care | Payer: Managed Care, Other (non HMO) | Source: Ambulatory Visit | Attending: Endocrinology | Admitting: Endocrinology

## 2017-01-01 DIAGNOSIS — E041 Nontoxic single thyroid nodule: Secondary | ICD-10-CM

## 2017-02-20 ENCOUNTER — Telehealth: Payer: Self-pay | Admitting: *Deleted

## 2017-02-20 NOTE — Telephone Encounter (Signed)
Received request for Medical records from [place] [employer], forwarded to [place] for email/scan/SLS 07/10    

## 2017-03-06 ENCOUNTER — Telehealth: Payer: Self-pay | Admitting: *Deleted

## 2017-03-06 NOTE — Telephone Encounter (Signed)
Received request for Medical records from [place], forwarded to [place] for email/scan/SLS 07/24

## 2017-03-14 LAB — HM PAP SMEAR

## 2017-03-14 LAB — HM MAMMOGRAPHY

## 2017-09-19 DIAGNOSIS — R110 Nausea: Secondary | ICD-10-CM | POA: Diagnosis not present

## 2017-09-19 DIAGNOSIS — R12 Heartburn: Secondary | ICD-10-CM | POA: Diagnosis not present

## 2017-09-25 DIAGNOSIS — M4003 Postural kyphosis, cervicothoracic region: Secondary | ICD-10-CM | POA: Diagnosis not present

## 2017-09-25 DIAGNOSIS — M9901 Segmental and somatic dysfunction of cervical region: Secondary | ICD-10-CM | POA: Diagnosis not present

## 2017-09-25 DIAGNOSIS — M9903 Segmental and somatic dysfunction of lumbar region: Secondary | ICD-10-CM | POA: Diagnosis not present

## 2017-09-25 DIAGNOSIS — M9907 Segmental and somatic dysfunction of upper extremity: Secondary | ICD-10-CM | POA: Diagnosis not present

## 2017-09-25 DIAGNOSIS — R51 Headache: Secondary | ICD-10-CM | POA: Diagnosis not present

## 2017-09-25 DIAGNOSIS — M9905 Segmental and somatic dysfunction of pelvic region: Secondary | ICD-10-CM | POA: Diagnosis not present

## 2017-09-25 DIAGNOSIS — M47812 Spondylosis without myelopathy or radiculopathy, cervical region: Secondary | ICD-10-CM | POA: Diagnosis not present

## 2017-09-25 DIAGNOSIS — M9902 Segmental and somatic dysfunction of thoracic region: Secondary | ICD-10-CM | POA: Diagnosis not present

## 2017-09-26 DIAGNOSIS — R110 Nausea: Secondary | ICD-10-CM | POA: Diagnosis not present

## 2017-09-26 DIAGNOSIS — R12 Heartburn: Secondary | ICD-10-CM | POA: Diagnosis not present

## 2017-10-03 DIAGNOSIS — R110 Nausea: Secondary | ICD-10-CM | POA: Diagnosis not present

## 2017-10-03 DIAGNOSIS — R12 Heartburn: Secondary | ICD-10-CM | POA: Diagnosis not present

## 2017-10-10 DIAGNOSIS — R12 Heartburn: Secondary | ICD-10-CM | POA: Diagnosis not present

## 2017-10-10 DIAGNOSIS — R110 Nausea: Secondary | ICD-10-CM | POA: Diagnosis not present

## 2017-10-17 DIAGNOSIS — R110 Nausea: Secondary | ICD-10-CM | POA: Diagnosis not present

## 2017-10-17 DIAGNOSIS — R12 Heartburn: Secondary | ICD-10-CM | POA: Diagnosis not present

## 2017-10-23 DIAGNOSIS — M47812 Spondylosis without myelopathy or radiculopathy, cervical region: Secondary | ICD-10-CM | POA: Diagnosis not present

## 2017-10-23 DIAGNOSIS — M9903 Segmental and somatic dysfunction of lumbar region: Secondary | ICD-10-CM | POA: Diagnosis not present

## 2017-10-23 DIAGNOSIS — M9907 Segmental and somatic dysfunction of upper extremity: Secondary | ICD-10-CM | POA: Diagnosis not present

## 2017-10-23 DIAGNOSIS — M9901 Segmental and somatic dysfunction of cervical region: Secondary | ICD-10-CM | POA: Diagnosis not present

## 2017-10-23 DIAGNOSIS — M4003 Postural kyphosis, cervicothoracic region: Secondary | ICD-10-CM | POA: Diagnosis not present

## 2017-10-23 DIAGNOSIS — M9902 Segmental and somatic dysfunction of thoracic region: Secondary | ICD-10-CM | POA: Diagnosis not present

## 2017-10-23 DIAGNOSIS — R51 Headache: Secondary | ICD-10-CM | POA: Diagnosis not present

## 2017-10-23 DIAGNOSIS — M9905 Segmental and somatic dysfunction of pelvic region: Secondary | ICD-10-CM | POA: Diagnosis not present

## 2017-10-24 DIAGNOSIS — R110 Nausea: Secondary | ICD-10-CM | POA: Diagnosis not present

## 2017-10-24 DIAGNOSIS — R12 Heartburn: Secondary | ICD-10-CM | POA: Diagnosis not present

## 2017-10-24 MED FILL — DRONABINOL 2.5 MG CAPS: 2.5 | 30 days supply | Qty: 60 | Fill #0

## 2017-10-29 DIAGNOSIS — R110 Nausea: Secondary | ICD-10-CM | POA: Diagnosis not present

## 2017-10-29 DIAGNOSIS — R12 Heartburn: Secondary | ICD-10-CM | POA: Diagnosis not present

## 2017-10-31 DIAGNOSIS — K224 Dyskinesia of esophagus: Secondary | ICD-10-CM | POA: Diagnosis not present

## 2017-10-31 DIAGNOSIS — K219 Gastro-esophageal reflux disease without esophagitis: Secondary | ICD-10-CM | POA: Diagnosis not present

## 2017-10-31 DIAGNOSIS — K3184 Gastroparesis: Secondary | ICD-10-CM | POA: Diagnosis not present

## 2017-11-07 DIAGNOSIS — R12 Heartburn: Secondary | ICD-10-CM | POA: Diagnosis not present

## 2017-11-07 DIAGNOSIS — R110 Nausea: Secondary | ICD-10-CM | POA: Diagnosis not present

## 2017-11-09 DIAGNOSIS — R1310 Dysphagia, unspecified: Secondary | ICD-10-CM | POA: Diagnosis not present

## 2017-11-14 DIAGNOSIS — R110 Nausea: Secondary | ICD-10-CM | POA: Diagnosis not present

## 2017-11-14 DIAGNOSIS — R12 Heartburn: Secondary | ICD-10-CM | POA: Diagnosis not present

## 2017-11-19 MED FILL — METOCLOPRAMIDE 5 MG TABLET: 5 | 90 days supply | Qty: 90 | Fill #0

## 2017-11-20 DIAGNOSIS — M9903 Segmental and somatic dysfunction of lumbar region: Secondary | ICD-10-CM | POA: Diagnosis not present

## 2017-11-20 DIAGNOSIS — R51 Headache: Secondary | ICD-10-CM | POA: Diagnosis not present

## 2017-11-20 DIAGNOSIS — M4003 Postural kyphosis, cervicothoracic region: Secondary | ICD-10-CM | POA: Diagnosis not present

## 2017-11-20 DIAGNOSIS — M9907 Segmental and somatic dysfunction of upper extremity: Secondary | ICD-10-CM | POA: Diagnosis not present

## 2017-11-20 DIAGNOSIS — M47812 Spondylosis without myelopathy or radiculopathy, cervical region: Secondary | ICD-10-CM | POA: Diagnosis not present

## 2017-11-20 DIAGNOSIS — M9901 Segmental and somatic dysfunction of cervical region: Secondary | ICD-10-CM | POA: Diagnosis not present

## 2017-11-20 DIAGNOSIS — M9902 Segmental and somatic dysfunction of thoracic region: Secondary | ICD-10-CM | POA: Diagnosis not present

## 2017-11-20 DIAGNOSIS — M9905 Segmental and somatic dysfunction of pelvic region: Secondary | ICD-10-CM | POA: Diagnosis not present

## 2017-11-21 DIAGNOSIS — R110 Nausea: Secondary | ICD-10-CM | POA: Diagnosis not present

## 2017-11-21 DIAGNOSIS — R12 Heartburn: Secondary | ICD-10-CM | POA: Diagnosis not present

## 2017-11-26 ENCOUNTER — Telehealth: Payer: Self-pay | Admitting: *Deleted

## 2017-11-26 NOTE — Telephone Encounter (Signed)
Received request for Medical records from [place], forwarded to [place] for email/scan/SLS 04/15

## 2017-11-28 DIAGNOSIS — R110 Nausea: Secondary | ICD-10-CM | POA: Diagnosis not present

## 2017-11-28 DIAGNOSIS — R12 Heartburn: Secondary | ICD-10-CM | POA: Diagnosis not present

## 2017-12-12 DIAGNOSIS — R110 Nausea: Secondary | ICD-10-CM | POA: Diagnosis not present

## 2017-12-12 DIAGNOSIS — R12 Heartburn: Secondary | ICD-10-CM | POA: Diagnosis not present

## 2017-12-19 DIAGNOSIS — R110 Nausea: Secondary | ICD-10-CM | POA: Diagnosis not present

## 2017-12-19 DIAGNOSIS — R12 Heartburn: Secondary | ICD-10-CM | POA: Diagnosis not present

## 2017-12-24 ENCOUNTER — Telehealth: Payer: Self-pay | Admitting: *Deleted

## 2017-12-24 NOTE — Telephone Encounter (Signed)
Received request for Medical records from [place], forwarded to [place] for email/scan/SLS 05/13

## 2017-12-25 DIAGNOSIS — M9907 Segmental and somatic dysfunction of upper extremity: Secondary | ICD-10-CM | POA: Diagnosis not present

## 2017-12-25 DIAGNOSIS — M9903 Segmental and somatic dysfunction of lumbar region: Secondary | ICD-10-CM | POA: Diagnosis not present

## 2017-12-25 DIAGNOSIS — M47812 Spondylosis without myelopathy or radiculopathy, cervical region: Secondary | ICD-10-CM | POA: Diagnosis not present

## 2017-12-25 DIAGNOSIS — M9902 Segmental and somatic dysfunction of thoracic region: Secondary | ICD-10-CM | POA: Diagnosis not present

## 2017-12-25 DIAGNOSIS — M9905 Segmental and somatic dysfunction of pelvic region: Secondary | ICD-10-CM | POA: Diagnosis not present

## 2017-12-25 DIAGNOSIS — M9901 Segmental and somatic dysfunction of cervical region: Secondary | ICD-10-CM | POA: Diagnosis not present

## 2017-12-25 DIAGNOSIS — M4003 Postural kyphosis, cervicothoracic region: Secondary | ICD-10-CM | POA: Diagnosis not present

## 2017-12-25 DIAGNOSIS — R51 Headache: Secondary | ICD-10-CM | POA: Diagnosis not present

## 2017-12-26 DIAGNOSIS — R110 Nausea: Secondary | ICD-10-CM | POA: Diagnosis not present

## 2017-12-26 DIAGNOSIS — R12 Heartburn: Secondary | ICD-10-CM | POA: Diagnosis not present

## 2018-01-02 DIAGNOSIS — R110 Nausea: Secondary | ICD-10-CM | POA: Diagnosis not present

## 2018-01-02 DIAGNOSIS — R12 Heartburn: Secondary | ICD-10-CM | POA: Diagnosis not present

## 2018-01-04 DIAGNOSIS — E041 Nontoxic single thyroid nodule: Secondary | ICD-10-CM | POA: Diagnosis not present

## 2018-01-08 ENCOUNTER — Other Ambulatory Visit: Payer: Self-pay | Admitting: Endocrinology

## 2018-01-08 DIAGNOSIS — E041 Nontoxic single thyroid nodule: Secondary | ICD-10-CM

## 2018-01-09 DIAGNOSIS — R110 Nausea: Secondary | ICD-10-CM | POA: Diagnosis not present

## 2018-01-09 DIAGNOSIS — R12 Heartburn: Secondary | ICD-10-CM | POA: Diagnosis not present

## 2018-01-14 ENCOUNTER — Ambulatory Visit
Admission: RE | Admit: 2018-01-14 | Discharge: 2018-01-14 | Disposition: A | Discharge status: Home / Self Care | Payer: Managed Care, Other (non HMO) | Source: Ambulatory Visit | Attending: Endocrinology | Admitting: Endocrinology

## 2018-01-14 DIAGNOSIS — E041 Nontoxic single thyroid nodule: Secondary | ICD-10-CM

## 2018-01-16 DIAGNOSIS — R110 Nausea: Secondary | ICD-10-CM | POA: Diagnosis not present

## 2018-01-16 DIAGNOSIS — R12 Heartburn: Secondary | ICD-10-CM | POA: Diagnosis not present

## 2018-01-16 MED FILL — DRONABINOL 2.5 MG CAPSULE: 2.5 | 30 days supply | Qty: 60 | Fill #0

## 2018-01-16 MED FILL — RABEPRAZOLE SOD DR 20 MG TA: 20 | 90 days supply | Qty: 180 | Fill #0

## 2018-01-21 DIAGNOSIS — L720 Epidermal cyst: Secondary | ICD-10-CM | POA: Diagnosis not present

## 2018-01-22 DIAGNOSIS — R51 Headache: Secondary | ICD-10-CM | POA: Diagnosis not present

## 2018-01-22 DIAGNOSIS — M9907 Segmental and somatic dysfunction of upper extremity: Secondary | ICD-10-CM | POA: Diagnosis not present

## 2018-01-22 DIAGNOSIS — M47812 Spondylosis without myelopathy or radiculopathy, cervical region: Secondary | ICD-10-CM | POA: Diagnosis not present

## 2018-01-22 DIAGNOSIS — M9903 Segmental and somatic dysfunction of lumbar region: Secondary | ICD-10-CM | POA: Diagnosis not present

## 2018-01-22 DIAGNOSIS — M9905 Segmental and somatic dysfunction of pelvic region: Secondary | ICD-10-CM | POA: Diagnosis not present

## 2018-01-22 DIAGNOSIS — M9902 Segmental and somatic dysfunction of thoracic region: Secondary | ICD-10-CM | POA: Diagnosis not present

## 2018-01-22 DIAGNOSIS — M4003 Postural kyphosis, cervicothoracic region: Secondary | ICD-10-CM | POA: Diagnosis not present

## 2018-01-22 DIAGNOSIS — M9901 Segmental and somatic dysfunction of cervical region: Secondary | ICD-10-CM | POA: Diagnosis not present

## 2018-01-23 DIAGNOSIS — R12 Heartburn: Secondary | ICD-10-CM | POA: Diagnosis not present

## 2018-01-23 DIAGNOSIS — R110 Nausea: Secondary | ICD-10-CM | POA: Diagnosis not present

## 2018-01-28 DIAGNOSIS — R110 Nausea: Secondary | ICD-10-CM | POA: Diagnosis not present

## 2018-01-28 DIAGNOSIS — R12 Heartburn: Secondary | ICD-10-CM | POA: Diagnosis not present

## 2018-01-30 DIAGNOSIS — K219 Gastro-esophageal reflux disease without esophagitis: Secondary | ICD-10-CM | POA: Diagnosis not present

## 2018-02-06 DIAGNOSIS — R12 Heartburn: Secondary | ICD-10-CM | POA: Diagnosis not present

## 2018-02-06 DIAGNOSIS — R110 Nausea: Secondary | ICD-10-CM | POA: Diagnosis not present

## 2018-02-06 MED FILL — DRONABINOL 5 MG CAPSULE: 5 | 30 days supply | Qty: 60 | Fill #0

## 2018-02-07 MED FILL — LEVOTHYROXINE 25 MCG TABLET: 25 | 90 days supply | Qty: 90 | Fill #0

## 2018-02-13 DIAGNOSIS — R12 Heartburn: Secondary | ICD-10-CM | POA: Diagnosis not present

## 2018-02-13 DIAGNOSIS — R110 Nausea: Secondary | ICD-10-CM | POA: Diagnosis not present

## 2018-02-18 ENCOUNTER — Other Ambulatory Visit: Payer: Self-pay

## 2018-02-18 ENCOUNTER — Encounter: Payer: Self-pay | Admitting: Family Medicine

## 2018-02-18 ENCOUNTER — Ambulatory Visit (INDEPENDENT_AMBULATORY_CARE_PROVIDER_SITE_OTHER): Payer: 59 | Admitting: Family Medicine

## 2018-02-18 VITALS — BP 122/76 | HR 97 | Temp 98.4°F | Ht 63.25 in | Wt 181.2 lb

## 2018-02-18 DIAGNOSIS — Z23 Encounter for immunization: Secondary | ICD-10-CM

## 2018-02-18 DIAGNOSIS — E559 Vitamin D deficiency, unspecified: Secondary | ICD-10-CM | POA: Diagnosis not present

## 2018-02-18 DIAGNOSIS — E042 Nontoxic multinodular goiter: Secondary | ICD-10-CM

## 2018-02-18 DIAGNOSIS — Z1283 Encounter for screening for malignant neoplasm of skin: Secondary | ICD-10-CM | POA: Diagnosis not present

## 2018-02-18 DIAGNOSIS — K580 Irritable bowel syndrome with diarrhea: Secondary | ICD-10-CM | POA: Diagnosis not present

## 2018-02-18 DIAGNOSIS — H02823 Cysts of right eye, unspecified eyelid: Secondary | ICD-10-CM | POA: Diagnosis not present

## 2018-02-18 DIAGNOSIS — K224 Dyskinesia of esophagus: Secondary | ICD-10-CM

## 2018-02-18 DIAGNOSIS — K589 Irritable bowel syndrome without diarrhea: Secondary | ICD-10-CM | POA: Insufficient documentation

## 2018-02-18 DIAGNOSIS — Z8639 Personal history of other endocrine, nutritional and metabolic disease: Secondary | ICD-10-CM

## 2018-02-18 HISTORY — DX: Personal history of other endocrine, nutritional and metabolic disease: Z86.39

## 2018-02-18 NOTE — Progress Notes (Signed)
Subjective  CC:  Chief Complaint  Patient presents with  . Establish Care    Transfer from Dr. Larose Kells, hasnt been seen since 2016, has been seeing Specialist for Primary care. Needs referral to Dermatology     HPI: Karen [name] is a 47 y.o. female who presents to [place] at [facility] today to establish care with me as a new patient. Sees [facility], dermatology and endocrinology. Has Dr. Corinna Capra for GYN; s/p partial hysterectomy for fibroids: supracervical.   She has the following concerns or needs:  Milia: needs new derm in thn network: Dr. Onalee Hua practice; requesting referral. Needs extractions.   Applied for disability due to GI issues: documented by GI: I have reviewed those notes. Has daily pain,reflux, ibs and gastroparesis that limits her QOL. No longer able to work.   H/o goiter s/p left partial thyroidectomy; balan monitoring goiter and pt takes small thyroid dose supplement. At goal by report checked in may.   HM: due for Tdap. Pap and mammo up to date. Due for CPE at her convenience  Assessment  1. Milia of right eyelid   2. Vitamin D deficiency   3. Skin cancer screening   4. Esophageal dysmotilities   5. Irritable bowel syndrome with diarrhea   6. Multinodular goiter      Plan   Refer to derm.   GI managing chronic GI issues, refractory. Negative depression screen  Return for cpe with labs.   Updated tdap today  Follow up:  Return in about 3 months (around 05/21/2018) for complete physical. Orders Placed This Encounter  Procedures  . HM MAMMOGRAPHY  . Tdap vaccine greater than or equal to 7yo IM  . HM PAP SMEAR  . Ambulatory referral to Dermatology   No orders of the defined types were placed in this encounter.    Depression screen [facility] 2/9 02/18/2018  Decreased Interest 0  Down, Depressed, Hopeless 0  PHQ - 2 Score 0  Altered sleeping 0  Tired, decreased energy 0  Change in appetite 0  Feeling bad or failure about yourself  0    Trouble concentrating 0  Moving slowly or fidgety/restless 0  Suicidal thoughts 0  PHQ-9 Score 0  Difficult doing work/chores Not difficult at all    We updated and reviewed the patient's past history in detail and it is documented below.  Patient Active Problem List   Diagnosis Date Noted  . Vitamin D deficiency 02/18/2018  . Milia of eyelid of right eye 02/18/2018  . Esophageal dysmotilities 02/18/2018  . IBS (irritable bowel syndrome) 02/18/2018  . Multinodular goiter 02/18/2018    Had partial thyroidectomy 1996; managed now by Dr. Chalmers Cater; monitoring for now; had recent biopsy.    . ALLERGIC RHINITIS 03/09/2010    Qualifier: Diagnosis of  By: Jerold Coombe     . Gastroparesis 10/18/2009    Qualifier: Diagnosis of  By: Larose Kells MD, [place] Gastroesophageal reflux disease without esophagitis 08/06/2007    Qualifier: Diagnosis of  By: Larose Kells MD, [place] Maintenance  Topic Date Due  . Samul Dada  07/03/1990  . INFLUENZA VACCINE  03/14/2018  . MAMMOGRAM  03/15/2019  . PAP SMEAR  03/14/2020  . HIV Screening  Discontinued   Immunization History  Administered Date(s) Administered  . Influenza,inj,Quad PF,6+ Mos 05/02/2017   Current Meds  Medication Sig  . aluminum hydroxide-magnesium carbonate (GAVISCON) 95-358 MG/15ML SUSP Take by mouth.  Marland Kitchen  cholestyramine (QUESTRAN) 4 G packet Take 4 g by mouth 2 (two) times daily.  Marland Kitchen dicyclomine (BENTYL) 10 MG capsule Take 10 mg by mouth 2 (two) times daily as needed for spasms.  Marland Kitchen dronabinol (MARINOL) 5 MG capsule Take by mouth.  . levothyroxine (LEVOTHROID) 25 MCG tablet Take 25 mcg by mouth daily.  . metoCLOPramide (REGLAN) 5 MG tablet Take 5 mg by mouth 3 (three) times daily before meals.  . Multiple Vitamin (MULTI-VITAMINS) TABS Take by mouth.  . Multiple Vitamin (MULTIVITAMIN WITH MINERALS) TABS Take 1 tablet by mouth daily.  . Ondansetron HCl (ZOFRAN PO) Take by mouth. AS NEEDED FOR NAUSEA AND/OR VOMITING  .  RABEprazole (ACIPHEX) 20 MG tablet Take by mouth.  . ranitidine (ZANTAC) 300 MG tablet Take by mouth.    Allergies: Patient is allergic to onion; hops oil; and hydrocortisone. Past Medical History Patient  has a past medical history of Chronic nausea, Gastroparesis, GERD (gastroesophageal reflux disease), History of multinodular goiter (02/18/2018), Hypothyroidism, IBS (irritable bowel syndrome), PONV (postoperative nausea and vomiting), Seasonal allergies, SVD (spontaneous vaginal delivery), and Thyroid nodule. Past Surgical History Patient  has a past surgical history that includes Thyroidectomy, partial; Cholecystectomy; Wisdom tooth extraction; Eye surgery; and Laparoscopic assisted vaginal hysterectomy (09/12/2012). Family History: Patient family history includes Breast cancer in her mother; Diabetes in her father; Healthy in her daughter and daughter. Social History:  Patient  reports that she has never smoked. She has never used smokeless tobacco. She reports that she does not drink alcohol or use drugs.  Review of Systems: Constitutional: negative for fever or malaise Ophthalmic: negative for photophobia, double vision or loss of vision Cardiovascular: negative for chest pain, dyspnea on exertion, or new LE swelling Respiratory: negative for SOB or persistent cough Gastrointestinal: negative for abdominal pain, change in bowel habits or melena Genitourinary: negative for dysuria or gross hematuria Musculoskeletal: negative for new gait disturbance or muscular weakness Integumentary: negative for new or persistent rashes Neurological: negative for TIA or stroke symptoms Psychiatric: negative for SI or delusions Allergic/Immunologic: negative for hives  Patient Care Team    Relationship Specialty Notifications Start End  Leamon Arnt, MD PCP - General Family Medicine  02/18/18   Scherrie November, MD Referring Physician Gastroenterology  06/19/16   Jacelyn Pi, MD Consulting  Physician Endocrinology  02/18/18   Louretta Shorten, MD Consulting Physician Obstetrics and Gynecology  02/18/18     Objective  Vitals: BP 122/76   Pulse 97   Temp 98.4 F (36.9 C)   Ht 5' 3.25" (1.607 m)   Wt 181 lb 3.2 oz (82.2 kg)   LMP 08/08/2012   SpO2 98%   BMI 31.84 kg/m  General:  Well developed, well nourished, no acute distress  Psych:  Alert and oriented,normal mood and affect HEENT:  Normocephalic, atraumatic, non-icteric sclera, PERRL, oropharynx is without mass or exudate, supple neck without adenopathy, mass, right nontender goiter present without dominant mass Cardiovascular:  RRR without gallop, rub or murmur, nondisplaced PMI Respiratory:  Good breath sounds bilaterally, CTAB with normal respiratory effort Skin:  Warm, right lower lid/cheek with multiple milia Neurologic:    Mental status is normal. Gross motor and sensory exams are normal. Normal gait   Commons side effects, risks, benefits, and alternatives for medications and treatment plan prescribed today were discussed, and the patient expressed understanding of the given instructions. Patient is instructed to call or message via MyChart if he/she has any questions or concerns regarding our treatment plan. No barriers  to understanding were identified. We discussed Red Flag symptoms and signs in detail. Patient expressed understanding regarding what to do in case of urgent or emergency type symptoms.   Medication list was reconciled, printed and provided to the patient in AVS. Patient instructions and summary information was reviewed with the patient as documented in the AVS. This note was prepared with assistance of Dragon voice recognition software. Occasional wrong-word or sound-a-like substitutions may have occurred due to the inherent limitations of voice recognition software

## 2018-02-18 NOTE — Patient Instructions (Signed)
Please return in 1-6 months for your complete physical and blood work, please come fasting.   It was a pleasure meeting you today! Thank you for choosing [place] to meet your healthcare needs! I truly look forward to working with you. If you have any questions or concerns, please send me a message via Mychart or call the office at [phone].  We will call you with information regarding your referral appointment. Dermatology. If you do not hear from [place] within the next 2 weeks, please let me know. It can take 1-2 weeks to get appointments set up with the specialists.

## 2018-02-19 DIAGNOSIS — M9905 Segmental and somatic dysfunction of pelvic region: Secondary | ICD-10-CM | POA: Diagnosis not present

## 2018-02-19 DIAGNOSIS — M47812 Spondylosis without myelopathy or radiculopathy, cervical region: Secondary | ICD-10-CM | POA: Diagnosis not present

## 2018-02-19 DIAGNOSIS — M9907 Segmental and somatic dysfunction of upper extremity: Secondary | ICD-10-CM | POA: Diagnosis not present

## 2018-02-19 DIAGNOSIS — M9903 Segmental and somatic dysfunction of lumbar region: Secondary | ICD-10-CM | POA: Diagnosis not present

## 2018-02-19 DIAGNOSIS — M9902 Segmental and somatic dysfunction of thoracic region: Secondary | ICD-10-CM | POA: Diagnosis not present

## 2018-02-19 DIAGNOSIS — M4003 Postural kyphosis, cervicothoracic region: Secondary | ICD-10-CM | POA: Diagnosis not present

## 2018-02-19 DIAGNOSIS — R51 Headache: Secondary | ICD-10-CM | POA: Diagnosis not present

## 2018-02-19 DIAGNOSIS — M9901 Segmental and somatic dysfunction of cervical region: Secondary | ICD-10-CM | POA: Diagnosis not present

## 2018-02-20 DIAGNOSIS — R12 Heartburn: Secondary | ICD-10-CM | POA: Diagnosis not present

## 2018-02-20 DIAGNOSIS — R110 Nausea: Secondary | ICD-10-CM | POA: Diagnosis not present

## 2018-02-27 DIAGNOSIS — R110 Nausea: Secondary | ICD-10-CM | POA: Diagnosis not present

## 2018-02-27 DIAGNOSIS — R12 Heartburn: Secondary | ICD-10-CM | POA: Diagnosis not present

## 2018-03-06 DIAGNOSIS — R110 Nausea: Secondary | ICD-10-CM | POA: Diagnosis not present

## 2018-03-06 DIAGNOSIS — R12 Heartburn: Secondary | ICD-10-CM | POA: Diagnosis not present

## 2018-03-12 MED FILL — METOCLOPRAMIDE 5 MG TABLET: 5 | 90 days supply | Qty: 90 | Fill #0

## 2018-03-13 DIAGNOSIS — R12 Heartburn: Secondary | ICD-10-CM | POA: Diagnosis not present

## 2018-03-13 DIAGNOSIS — R110 Nausea: Secondary | ICD-10-CM | POA: Diagnosis not present

## 2018-03-14 DIAGNOSIS — Z01419 Encounter for gynecological examination (general) (routine) without abnormal findings: Secondary | ICD-10-CM | POA: Diagnosis not present

## 2018-03-14 DIAGNOSIS — Z6832 Body mass index (BMI) 32.0-32.9, adult: Secondary | ICD-10-CM | POA: Diagnosis not present

## 2018-03-14 DIAGNOSIS — Z1231 Encounter for screening mammogram for malignant neoplasm of breast: Secondary | ICD-10-CM | POA: Diagnosis not present

## 2018-03-19 DIAGNOSIS — M9905 Segmental and somatic dysfunction of pelvic region: Secondary | ICD-10-CM | POA: Diagnosis not present

## 2018-03-19 DIAGNOSIS — M4003 Postural kyphosis, cervicothoracic region: Secondary | ICD-10-CM | POA: Diagnosis not present

## 2018-03-19 DIAGNOSIS — M9902 Segmental and somatic dysfunction of thoracic region: Secondary | ICD-10-CM | POA: Diagnosis not present

## 2018-03-19 DIAGNOSIS — M9901 Segmental and somatic dysfunction of cervical region: Secondary | ICD-10-CM | POA: Diagnosis not present

## 2018-03-19 DIAGNOSIS — R51 Headache: Secondary | ICD-10-CM | POA: Diagnosis not present

## 2018-03-19 DIAGNOSIS — M47812 Spondylosis without myelopathy or radiculopathy, cervical region: Secondary | ICD-10-CM | POA: Diagnosis not present

## 2018-03-19 DIAGNOSIS — M9907 Segmental and somatic dysfunction of upper extremity: Secondary | ICD-10-CM | POA: Diagnosis not present

## 2018-03-19 DIAGNOSIS — M9903 Segmental and somatic dysfunction of lumbar region: Secondary | ICD-10-CM | POA: Diagnosis not present

## 2018-03-20 DIAGNOSIS — R110 Nausea: Secondary | ICD-10-CM | POA: Diagnosis not present

## 2018-03-20 DIAGNOSIS — R12 Heartburn: Secondary | ICD-10-CM | POA: Diagnosis not present

## 2018-03-27 DIAGNOSIS — R110 Nausea: Secondary | ICD-10-CM | POA: Diagnosis not present

## 2018-03-27 DIAGNOSIS — R12 Heartburn: Secondary | ICD-10-CM | POA: Diagnosis not present

## 2018-03-28 DIAGNOSIS — R110 Nausea: Secondary | ICD-10-CM | POA: Diagnosis not present

## 2018-03-28 DIAGNOSIS — R12 Heartburn: Secondary | ICD-10-CM | POA: Diagnosis not present

## 2018-03-28 DIAGNOSIS — K2950 Unspecified chronic gastritis without bleeding: Secondary | ICD-10-CM | POA: Diagnosis not present

## 2018-03-28 DIAGNOSIS — R112 Nausea with vomiting, unspecified: Secondary | ICD-10-CM | POA: Diagnosis not present

## 2018-03-28 DIAGNOSIS — K2970 Gastritis, unspecified, without bleeding: Secondary | ICD-10-CM | POA: Diagnosis not present

## 2018-04-01 ENCOUNTER — Other Ambulatory Visit: Payer: Self-pay

## 2018-04-01 DIAGNOSIS — D2261 Melanocytic nevi of right upper limb, including shoulder: Secondary | ICD-10-CM | POA: Diagnosis not present

## 2018-04-01 DIAGNOSIS — D485 Neoplasm of uncertain behavior of skin: Secondary | ICD-10-CM | POA: Diagnosis not present

## 2018-04-03 DIAGNOSIS — R12 Heartburn: Secondary | ICD-10-CM | POA: Diagnosis not present

## 2018-04-03 DIAGNOSIS — R110 Nausea: Secondary | ICD-10-CM | POA: Diagnosis not present

## 2018-04-04 MED FILL — TRETINOIN 0.025% CREAM: 0.025 | 25 days supply | Qty: 20 | Fill #0

## 2018-04-16 DIAGNOSIS — M47812 Spondylosis without myelopathy or radiculopathy, cervical region: Secondary | ICD-10-CM | POA: Diagnosis not present

## 2018-04-16 DIAGNOSIS — R51 Headache: Secondary | ICD-10-CM | POA: Diagnosis not present

## 2018-04-16 DIAGNOSIS — M4003 Postural kyphosis, cervicothoracic region: Secondary | ICD-10-CM | POA: Diagnosis not present

## 2018-04-16 DIAGNOSIS — M9902 Segmental and somatic dysfunction of thoracic region: Secondary | ICD-10-CM | POA: Diagnosis not present

## 2018-04-16 DIAGNOSIS — M9901 Segmental and somatic dysfunction of cervical region: Secondary | ICD-10-CM | POA: Diagnosis not present

## 2018-04-16 DIAGNOSIS — M9905 Segmental and somatic dysfunction of pelvic region: Secondary | ICD-10-CM | POA: Diagnosis not present

## 2018-04-16 DIAGNOSIS — M9903 Segmental and somatic dysfunction of lumbar region: Secondary | ICD-10-CM | POA: Diagnosis not present

## 2018-04-16 DIAGNOSIS — M9907 Segmental and somatic dysfunction of upper extremity: Secondary | ICD-10-CM | POA: Diagnosis not present

## 2018-04-16 MED FILL — RABEPRAZOLE SOD DR 20 MG TA: 20 | 90 days supply | Qty: 180 | Fill #1

## 2018-04-17 DIAGNOSIS — R110 Nausea: Secondary | ICD-10-CM | POA: Diagnosis not present

## 2018-04-17 DIAGNOSIS — R12 Heartburn: Secondary | ICD-10-CM | POA: Diagnosis not present

## 2018-04-22 MED FILL — DRONABINOL 2.5 MG CAPSULE: 2.5 | 30 days supply | Qty: 60 | Fill #0

## 2018-04-24 DIAGNOSIS — R110 Nausea: Secondary | ICD-10-CM | POA: Diagnosis not present

## 2018-04-24 DIAGNOSIS — R12 Heartburn: Secondary | ICD-10-CM | POA: Diagnosis not present

## 2018-04-30 ENCOUNTER — Encounter: Payer: 59 | Admitting: Family Medicine

## 2018-05-07 ENCOUNTER — Encounter: Payer: Self-pay | Admitting: Family Medicine

## 2018-05-07 ENCOUNTER — Other Ambulatory Visit: Payer: Self-pay

## 2018-05-07 ENCOUNTER — Ambulatory Visit (INDEPENDENT_AMBULATORY_CARE_PROVIDER_SITE_OTHER): Payer: 59 | Admitting: Family Medicine

## 2018-05-07 VITALS — BP 120/82 | HR 92 | Temp 98.6°F | Ht 63.25 in | Wt 177.4 lb

## 2018-05-07 DIAGNOSIS — Z0000 Encounter for general adult medical examination without abnormal findings: Secondary | ICD-10-CM

## 2018-05-07 DIAGNOSIS — E559 Vitamin D deficiency, unspecified: Secondary | ICD-10-CM

## 2018-05-07 DIAGNOSIS — K580 Irritable bowel syndrome with diarrhea: Secondary | ICD-10-CM | POA: Diagnosis not present

## 2018-05-07 DIAGNOSIS — K3184 Gastroparesis: Secondary | ICD-10-CM | POA: Diagnosis not present

## 2018-05-07 DIAGNOSIS — E042 Nontoxic multinodular goiter: Secondary | ICD-10-CM

## 2018-05-07 DIAGNOSIS — Z23 Encounter for immunization: Secondary | ICD-10-CM

## 2018-05-07 LAB — CBC WITH DIFFERENTIAL/PLATELET
Basophils Absolute: 0.1 10*3/uL (ref 0.0–0.1)
Basophils Relative: 0.5 % (ref 0.0–3.0)
EOS PCT: 0.4 % (ref 0.0–5.0)
Eosinophils Absolute: 0 10*3/uL (ref 0.0–0.7)
HCT: 41.7 % (ref 36.0–46.0)
Hemoglobin: 13.6 g/dL (ref 12.0–15.0)
LYMPHS ABS: 2.9 10*3/uL (ref 0.7–4.0)
Lymphocytes Relative: 25.5 % (ref 12.0–46.0)
MCHC: 32.7 g/dL (ref 30.0–36.0)
MCV: 80.7 fl (ref 78.0–100.0)
Monocytes Absolute: 0.8 10*3/uL (ref 0.1–1.0)
Monocytes Relative: 6.7 % (ref 3.0–12.0)
NEUTROS PCT: 66.9 % (ref 43.0–77.0)
Neutro Abs: 7.6 10*3/uL (ref 1.4–7.7)
Platelets: 305 10*3/uL (ref 150.0–400.0)
RBC: 5.16 Mil/uL — AB (ref 3.87–5.11)
RDW: 14.9 % (ref 11.5–15.5)
WBC: 11.3 10*3/uL — ABNORMAL HIGH (ref 4.0–10.5)

## 2018-05-07 LAB — COMPREHENSIVE METABOLIC PANEL
ALT: 11 U/L (ref 0–35)
AST: 14 U/L (ref 0–37)
Albumin: 4.5 g/dL (ref 3.5–5.2)
Alkaline Phosphatase: 60 U/L (ref 39–117)
BUN: 9 mg/dL (ref 6–23)
CO2: 23 meq/L (ref 19–32)
Calcium: 9.5 mg/dL (ref 8.4–10.5)
Chloride: 106 mEq/L (ref 96–112)
Creatinine, Ser: 1.01 mg/dL (ref 0.40–1.20)
GFR: 62.49 mL/min (ref >60.00)
Glucose, Bld: 108 mg/dL — ABNORMAL HIGH (ref 70–99)
POTASSIUM: 4.2 meq/L (ref 3.5–5.1)
SODIUM: 137 meq/L (ref 135–145)
TOTAL PROTEIN: 7.9 g/dL (ref 6.0–8.3)
Total Bilirubin: 0.8 mg/dL (ref 0.2–1.2)

## 2018-05-07 LAB — LIPID PANEL
Cholesterol: 207 mg/dL — ABNORMAL HIGH (ref 0–200)
HDL: 38.2 mg/dL — ABNORMAL LOW (ref >39.00)
LDL Cholesterol: 141 mg/dL — ABNORMAL HIGH (ref 0–99)
NonHDL: 168.61
Total CHOL/HDL Ratio: 5
Triglycerides: 136 mg/dL (ref 0.0–149.0)
VLDL: 27.2 mg/dL (ref 0.0–40.0)

## 2018-05-07 LAB — VITAMIN D 25 HYDROXY (VIT D DEFICIENCY, FRACTURES): VITD: 14.25 ng/mL — ABNORMAL LOW (ref 30.00–100.00)

## 2018-05-07 LAB — TSH: TSH: 1.02 u[IU]/mL (ref 0.35–4.50)

## 2018-05-07 NOTE — Patient Instructions (Addendum)
Please return in 12 months for your annual complete physical; please come fasting. I will release your lab results to you on your MyChart account with further instructions. Please reply with any questions.    If you have any questions or concerns, please don't hesitate to send me a message via MyChart or call the office at [phone]. Thank you for visiting with [place] today! It's our pleasure caring for you.  Stress and Stress Management Stress is a normal reaction to life events. It is what you feel when life demands more than you are used to or more than you can handle. Some stress can be useful. For example, the stress reaction can help you catch the last bus of the day, study for a test, or meet a deadline at work. But stress that occurs too often or for too long can cause problems. It can affect your emotional health and interfere with relationships and normal daily activities. Too much stress can weaken your immune system and increase your risk for physical illness. If you already have a medical problem, stress can make it worse. What are the causes? All sorts of life events may cause stress. An event that causes stress for one person may not be stressful for another person. Major life events commonly cause stress. These may be positive or negative. Examples include losing your job, moving into a new home, getting married, having a baby, or losing a loved one. Less obvious life events may also cause stress, especially if they occur day after day or in combination. Examples include working long hours, driving in traffic, caring for children, being in debt, or being in a difficult relationship. What are the signs or symptoms? Stress may cause emotional symptoms including, the following:  Anxiety. This is feeling worried, afraid, on edge, overwhelmed, or out of control.  Anger. This is feeling irritated or impatient.  Depression. This is feeling sad, down, helpless, or guilty.  Difficulty  focusing, remembering, or making decisions.  Stress may cause physical symptoms, including the following:  Aches and pains. These may affect your head, neck, back, stomach, or other areas of your body.  Tight muscles or clenched jaw.  Low energy or trouble sleeping.  Stress may cause unhealthy behaviors, including the following:  Eating to feel better (overeating) or skipping meals.  Sleeping too little, too much, or both.  Working too much or putting off tasks (procrastination).  Smoking, drinking alcohol, or using drugs to feel better.  How is this diagnosed? Stress is diagnosed through an assessment by your health care provider. Your health care provider will ask questions about your symptoms and any stressful life events.Your health care provider will also ask about your medical history and may order blood tests or other tests. Certain medical conditions and medicine can cause physical symptoms similar to stress. Mental illness can cause emotional symptoms and unhealthy behaviors similar to stress. Your health care provider may refer you to a mental health professional for further evaluation. How is this treated? Stress management is the recommended treatment for stress.The goals of stress management are reducing stressful life events and coping with stress in healthy ways. Techniques for reducing stressful life events include the following:  Stress identification. Self-monitor for stress and identify what causes stress for you. These skills may help you to avoid some stressful events.  Time management. Set your priorities, keep a calendar of events, and learn to say "no." These tools can help you avoid making too many commitments.  Techniques  for coping with stress include the following:  Rethinking the problem. Try to think realistically about stressful events rather than ignoring them or overreacting. Try to find the positives in a stressful situation rather than focusing on  the negatives.  Exercise. Physical exercise can release both physical and emotional tension. The key is to find a form of exercise you enjoy and do it regularly.  Relaxation techniques. These relax the body and mind. Examples include yoga, meditation, tai chi, biofeedback, deep breathing, progressive muscle relaxation, listening to music, being out in nature, journaling, and other hobbies. Again, the key is to find one or more that you enjoy and can do regularly.  Healthy lifestyle. Eat a balanced diet, get plenty of sleep, and do not smoke. Avoid using alcohol or drugs to relax.  Strong support network. Spend time with family, friends, or other people you enjoy being around.Express your feelings and talk things over with someone you trust.  Counseling or talktherapy with a mental health professional may be helpful if you are having difficulty managing stress on your own. Medicine is typically not recommended for the treatment of stress.Talk to your health care provider if you think you need medicine for symptoms of stress. Follow these instructions at home:  Keep all follow-up visits as directed by your health care provider.  Take all medicines as directed by your health care provider. Contact a health care provider if:  Your symptoms get worse or you start having new symptoms.  You feel overwhelmed by your problems and can no longer manage them on your own. Get help right away if:  You feel like hurting yourself or someone else. This information is not intended to replace advice given to you by your health care provider. Make sure you discuss any questions you have with your health care provider. Document Released: 01/24/2001 Document Revised: 01/06/2016 Document Reviewed: 03/25/2013 Elsevier Interactive Patient Education  2017 [employer].   Please do these things to maintain good health!   Exercise at least 30-45 minutes a day,  4-5 days a week.   Eat a low-fat diet with  lots of fruits and vegetables, up to 7-9 servings per day.  Drink plenty of water daily. Try to drink 8 8oz glasses per day.  Seatbelts can save your life. Always wear your seatbelt.  Place Smoke Detectors on every level of your home and check batteries every year.  Schedule an appointment with an eye doctor for an eye exam every 1-2 years  Safe sex - use condoms to protect yourself from STDs if you could be exposed to these types of infections. Use birth control if you do not want to become pregnant and are sexually active.  Avoid heavy alcohol use. If you drink, keep it to less than 2 drinks/day and not every day.  [place].  Choose someone you trust that could speak for you if you became unable to speak for yourself.  Depression is common in our stressful world.If you're feeling down or losing interest in things you normally enjoy, please come in for a visit.  If anyone is threatening or hurting you, please get help. Physical or Emotional Violence is never OK.

## 2018-05-07 NOTE — Progress Notes (Signed)
Subjective  Chief Complaint  Patient presents with  . Annual Exam    Patient is fasting, she states that she is having a rough day with her Gastroparesis, flu shot today     HPI: Karen [name] is a 47 y.o. female who presents to [place] at [facility] today for a Female Wellness Visit.   Wellness Visit: annual visit with health maintenance review and exam without Pap   Annual exam without Pap: Patient is extremely stressed.  Father is in the hospital with possible stroke, mother may have metastatic recurrent breast cancer, daughter recently had concussion from soccer over the weekend.  Patient is feeling overwhelmed.  GI symptoms are active.  Gastroparesis/IBS: Recent EGD by gastroenterology with esophageal dilatation however patient does not feel better.  Reviewed recent notes.  No new symptoms.  Currently undergoing evaluation for disability.  She denies mood disorder.  Assessment  1. Annual physical exam   2. Gastroparesis   3. Multinodular goiter   4. Irritable bowel syndrome with diarrhea   5. Vitamin D deficiency   6. Need for influenza vaccination      Plan  Female Wellness Visit:  Age appropriate Health Maintenance and Prevention measures were discussed with patient. Included topics are cancer screening recommendations, ways to keep healthy (see AVS) including dietary and exercise recommendations, regular eye and dental care, use of seat belts, and avoidance of moderate alcohol use and tobacco use.  Cancer screenings up-to-date and normal  BMI: discussed patient's BMI and encouraged positive lifestyle modifications to help get to or maintain a target BMI.  HM needs and immunizations were addressed and ordered. See below for orders. See HM and immunization section for updates.  Influenza vaccination today  Routine labs and screening tests ordered including cmp, cbc and lipids where appropriate.  Discussed recommendations regarding Vit D and  calcium supplementation (see AVS)  Gastroparesis/IBS: Active.  Discussed relationship between stress and symptoms.  Discussed relaxation techniques and stress management options.  Follow-up with GI for further management.  Recheck TSH  Follow up: Return in about 1 year (around 05/08/2019) for complete physical.   Orders Placed This Encounter  Procedures  . Flu Vaccine QUAD 36+ mos IM  . CBC with Differential/Platelet  . Comprehensive metabolic panel  . Lipid panel  . TSH  . VITAMIN D 25 Hydroxy (Vit-D Deficiency, Fractures)   No orders of the defined types were placed in this encounter.     Lifestyle: Body mass index is 31.18 kg/m. Wt Readings from Last 3 Encounters:  05/07/18 177 lb 6.4 oz (80.5 kg)  02/18/18 181 lb 3.2 oz (82.2 kg)  01/26/15 160 lb (72.6 kg)     Patient Active Problem List   Diagnosis Date Noted  . Vitamin D deficiency 02/18/2018  . Milia of eyelid of right eye 02/18/2018  . Esophageal dysmotilities 02/18/2018  . IBS (irritable bowel syndrome) 02/18/2018  . Multinodular goiter 02/18/2018    Had partial thyroidectomy 1996; managed now by Dr. Chalmers Cater; monitoring for now; had recent biopsy.    . ALLERGIC RHINITIS 03/09/2010    Qualifier: Diagnosis of  By: Jerold Coombe     . Gastroparesis 10/18/2009    Qualifier: Diagnosis of  By: Larose Kells MD, [place] Gastroesophageal reflux disease without esophagitis 08/06/2007    Qualifier: Diagnosis of  By: Larose Kells MD, [place] Maintenance  Topic Date Due  . MAMMOGRAM  04/01/2019  . PAP SMEAR  03/31/2021  . TETANUS/TDAP  02/19/2028  . INFLUENZA VACCINE  Completed  . HIV Screening  Discontinued   Immunization History  Administered Date(s) Administered  . Influenza,inj,Quad PF,6+ Mos 05/02/2017, 05/07/2018  . Tdap 02/18/2018   We updated and reviewed the patient's past history in detail and it is documented below. Allergies: Patient is allergic to onion; hops oil; and hydrocortisone. Past  Medical History Patient  has a past medical history of Chronic nausea, Gastroparesis, GERD (gastroesophageal reflux disease), History of multinodular goiter (02/18/2018), Hypothyroidism, IBS (irritable bowel syndrome), PONV (postoperative nausea and vomiting), Seasonal allergies, SVD (spontaneous vaginal delivery), and Thyroid nodule. Past Surgical History Patient  has a past surgical history that includes Thyroidectomy, partial; Cholecystectomy; Wisdom tooth extraction; Eye surgery; and Laparoscopic assisted vaginal hysterectomy (09/12/2012). Family History: Patient family history includes Breast cancer in her mother; Cancer in her maternal grandmother; Diabetes in her father; Healthy in her daughter and daughter; Heart attack in her paternal grandfather. Social History:  Patient  reports that she has never smoked. She has never used smokeless tobacco. She reports that she does not drink alcohol or use drugs.  Review of Systems: Constitutional: negative for fever or malaise Ophthalmic: negative for photophobia, double vision or loss of vision Cardiovascular: negative for chest pain, dyspnea on exertion, or new LE swelling Respiratory: negative for SOB or persistent cough Gastrointestinal: negative for change in bowel habits or melena Genitourinary: negative for dysuria or gross hematuria, no abnormal uterine bleeding or disharge Musculoskeletal: negative for new gait disturbance or muscular weakness Integumentary: negative for new or persistent rashes, no breast lumps Neurological: negative for TIA or stroke symptoms Psychiatric: negative for SI or delusions Allergic/Immunologic: negative for hives Patient Care Team    Relationship Specialty Notifications Start End  Leamon Arnt, MD PCP - General Family Medicine  02/18/18   Scherrie November, MD Referring Physician Gastroenterology  06/19/16   Jacelyn Pi, MD Consulting Physician Endocrinology  02/18/18   Louretta Shorten, MD Consulting Physician  Obstetrics and Gynecology  02/18/18     Objective  Vitals: BP 120/82   Pulse 92   Temp 98.6 F (37 C)   Ht 5' 3.25" (1.607 m)   Wt 177 lb 6.4 oz (80.5 kg)   LMP 08/08/2012   SpO2 92%   BMI 31.18 kg/m  General:  Well developed, well nourished, no acute distress  Psych:  Alert and orientedx3, anxious mood and affect HEENT:  Normocephalic, atraumatic, non-icteric sclera, PERRL, oropharynx is clear without mass or exudate, supple neck without adenopathy, mass or thyromegaly Cardiovascular:  Normal S1, S2, RRR without gallop, rub or murmur, nondisplaced PMI Respiratory:  Good breath sounds bilaterally, CTAB with normal respiratory effort Gastrointestinal: normal bowel sounds, soft, non-tender, no noted masses. No HSM MSK: no deformities, contusions. Joints are without erythema or swelling. Spine and CVA region are nontender Skin:  Warm, no rashes or suspicious lesions noted Neurologic:    Mental status is normal. CN 2-11 are normal. Gross motor and sensory exams are normal. Normal gait. No tremor Breast Exam: No mass, skin retraction or nipple discharge is appreciated in either breast. No axillary adenopathy. Fibrocystic changes are not noted     Commons side effects, risks, benefits, and alternatives for medications and treatment plan prescribed today were discussed, and the patient expressed understanding of the given instructions. Patient is instructed to call or message via MyChart if he/she has any questions or concerns regarding our treatment plan. No barriers to understanding were identified. We discussed Red Flag  symptoms and signs in detail. Patient expressed understanding regarding what to do in case of urgent or emergency type symptoms.   Medication list was reconciled, printed and provided to the patient in AVS. Patient instructions and summary information was reviewed with the patient as documented in the AVS. This note was prepared with assistance of Dragon voice recognition  software. Occasional wrong-word or sound-a-like substitutions may have occurred due to the inherent limitations of voice recognition software

## 2018-05-08 MED ORDER — VITAMIN D (ERGOCALCIFEROL) 1.25 MG (50000 UNIT) PO CAPS: 50000.0000 [IU] | ORAL_CAPSULE | ORAL | 0 refills | Status: AC

## 2018-05-08 MED FILL — VIT D2 1.25 MG (50,000 UNIT: 1.25 MG | 84 days supply | Qty: 12 | Fill #0

## 2018-05-08 NOTE — Addendum Note (Signed)
Addended by: Billey Chang on: 05/08/2018 08:02 AM   Modules accepted: Orders

## 2018-05-09 ENCOUNTER — Encounter: Payer: Self-pay | Admitting: Family Medicine

## 2018-05-13 MED FILL — LEVOTHYROXINE 25 MCG TABLET: 25 | 90 days supply | Qty: 90 | Fill #1

## 2018-05-14 DIAGNOSIS — M9903 Segmental and somatic dysfunction of lumbar region: Secondary | ICD-10-CM | POA: Diagnosis not present

## 2018-05-14 DIAGNOSIS — M9901 Segmental and somatic dysfunction of cervical region: Secondary | ICD-10-CM | POA: Diagnosis not present

## 2018-05-14 DIAGNOSIS — M9902 Segmental and somatic dysfunction of thoracic region: Secondary | ICD-10-CM | POA: Diagnosis not present

## 2018-05-14 DIAGNOSIS — M47812 Spondylosis without myelopathy or radiculopathy, cervical region: Secondary | ICD-10-CM | POA: Diagnosis not present

## 2018-05-14 DIAGNOSIS — M4003 Postural kyphosis, cervicothoracic region: Secondary | ICD-10-CM | POA: Diagnosis not present

## 2018-05-22 DIAGNOSIS — R12 Heartburn: Secondary | ICD-10-CM | POA: Diagnosis not present

## 2018-05-22 DIAGNOSIS — R110 Nausea: Secondary | ICD-10-CM | POA: Diagnosis not present

## 2018-05-29 DIAGNOSIS — R110 Nausea: Secondary | ICD-10-CM | POA: Diagnosis not present

## 2018-05-29 DIAGNOSIS — R12 Heartburn: Secondary | ICD-10-CM | POA: Diagnosis not present

## 2018-06-05 DIAGNOSIS — R12 Heartburn: Secondary | ICD-10-CM | POA: Diagnosis not present

## 2018-06-05 DIAGNOSIS — R110 Nausea: Secondary | ICD-10-CM | POA: Diagnosis not present

## 2018-06-07 MED FILL — METOCLOPRAMIDE 5 MG TABLET: 5 | 90 days supply | Qty: 90 | Fill #0

## 2018-06-12 DIAGNOSIS — R12 Heartburn: Secondary | ICD-10-CM | POA: Diagnosis not present

## 2018-06-12 DIAGNOSIS — R110 Nausea: Secondary | ICD-10-CM | POA: Diagnosis not present

## 2018-06-19 DIAGNOSIS — R110 Nausea: Secondary | ICD-10-CM | POA: Diagnosis not present

## 2018-06-19 DIAGNOSIS — R12 Heartburn: Secondary | ICD-10-CM | POA: Diagnosis not present

## 2018-06-24 DIAGNOSIS — R12 Heartburn: Secondary | ICD-10-CM | POA: Diagnosis not present

## 2018-06-24 DIAGNOSIS — R110 Nausea: Secondary | ICD-10-CM | POA: Diagnosis not present

## 2018-06-26 DIAGNOSIS — K3184 Gastroparesis: Secondary | ICD-10-CM | POA: Diagnosis not present

## 2018-06-26 DIAGNOSIS — R5381 Other malaise: Secondary | ICD-10-CM | POA: Diagnosis not present

## 2018-06-26 DIAGNOSIS — R5383 Other fatigue: Secondary | ICD-10-CM | POA: Diagnosis not present

## 2018-06-26 MED FILL — PYRIDOSTIGMINE BR 60 MG TAB: 60 | 30 days supply | Qty: 45 | Fill #0

## 2018-07-03 DIAGNOSIS — R12 Heartburn: Secondary | ICD-10-CM | POA: Diagnosis not present

## 2018-07-03 DIAGNOSIS — R110 Nausea: Secondary | ICD-10-CM | POA: Diagnosis not present

## 2018-07-04 MED FILL — DRONABINOL 2.5 MG CAPSULE: 2.5 | 30 days supply | Qty: 60 | Fill #1

## 2018-07-08 MED FILL — RABEPRAZOLE SOD DR 20 MG TA: 20 | 90 days supply | Qty: 180 | Fill #2

## 2018-07-24 DIAGNOSIS — R12 Heartburn: Secondary | ICD-10-CM | POA: Diagnosis not present

## 2018-07-24 DIAGNOSIS — R110 Nausea: Secondary | ICD-10-CM | POA: Diagnosis not present

## 2018-07-31 DIAGNOSIS — R110 Nausea: Secondary | ICD-10-CM | POA: Diagnosis not present

## 2018-07-31 DIAGNOSIS — R12 Heartburn: Secondary | ICD-10-CM | POA: Diagnosis not present

## 2018-08-01 ENCOUNTER — Encounter: Payer: Self-pay | Admitting: Family Medicine

## 2018-08-08 MED FILL — TRETINOIN 0.025% CREAM: 0.025 | 25 days supply | Qty: 20 | Fill #1

## 2018-08-08 MED FILL — LEVOTHYROXINE 25 MCG TABLET: 25 | 90 days supply | Qty: 90 | Fill #2

## 2018-08-21 DIAGNOSIS — R110 Nausea: Secondary | ICD-10-CM | POA: Diagnosis not present

## 2018-08-21 DIAGNOSIS — R197 Diarrhea, unspecified: Secondary | ICD-10-CM | POA: Diagnosis not present

## 2018-08-21 DIAGNOSIS — R1084 Generalized abdominal pain: Secondary | ICD-10-CM | POA: Diagnosis not present

## 2018-08-28 DIAGNOSIS — R197 Diarrhea, unspecified: Secondary | ICD-10-CM | POA: Diagnosis not present

## 2018-08-28 DIAGNOSIS — R1084 Generalized abdominal pain: Secondary | ICD-10-CM | POA: Diagnosis not present

## 2018-08-28 DIAGNOSIS — R110 Nausea: Secondary | ICD-10-CM | POA: Diagnosis not present

## 2018-09-04 DIAGNOSIS — R197 Diarrhea, unspecified: Secondary | ICD-10-CM | POA: Diagnosis not present

## 2018-09-04 DIAGNOSIS — R1084 Generalized abdominal pain: Secondary | ICD-10-CM | POA: Diagnosis not present

## 2018-09-04 DIAGNOSIS — R110 Nausea: Secondary | ICD-10-CM | POA: Diagnosis not present

## 2018-09-11 DIAGNOSIS — R1084 Generalized abdominal pain: Secondary | ICD-10-CM | POA: Diagnosis not present

## 2018-09-11 DIAGNOSIS — R110 Nausea: Secondary | ICD-10-CM | POA: Diagnosis not present

## 2018-09-11 DIAGNOSIS — R197 Diarrhea, unspecified: Secondary | ICD-10-CM | POA: Diagnosis not present

## 2018-09-11 MED FILL — METOCLOPRAMIDE 5 MG TABLET: 5 | 90 days supply | Qty: 90 | Fill #0

## 2018-09-11 MED FILL — PYRIDOSTIGMINE BR 60 MG TAB: 60 | 30 days supply | Qty: 45 | Fill #1

## 2018-09-18 DIAGNOSIS — R197 Diarrhea, unspecified: Secondary | ICD-10-CM | POA: Diagnosis not present

## 2018-09-18 DIAGNOSIS — R1084 Generalized abdominal pain: Secondary | ICD-10-CM | POA: Diagnosis not present

## 2018-09-18 DIAGNOSIS — R110 Nausea: Secondary | ICD-10-CM | POA: Diagnosis not present

## 2018-09-23 DIAGNOSIS — R197 Diarrhea, unspecified: Secondary | ICD-10-CM | POA: Diagnosis not present

## 2018-09-23 DIAGNOSIS — R1084 Generalized abdominal pain: Secondary | ICD-10-CM | POA: Diagnosis not present

## 2018-09-23 DIAGNOSIS — R110 Nausea: Secondary | ICD-10-CM | POA: Diagnosis not present

## 2018-10-02 DIAGNOSIS — R110 Nausea: Secondary | ICD-10-CM | POA: Diagnosis not present

## 2018-10-02 DIAGNOSIS — R197 Diarrhea, unspecified: Secondary | ICD-10-CM | POA: Diagnosis not present

## 2018-10-02 DIAGNOSIS — R1084 Generalized abdominal pain: Secondary | ICD-10-CM | POA: Diagnosis not present

## 2018-10-05 IMAGING — US US THYROID
1 series · 13 of 25 positions shown · non-contrast
Comparison: Most recent prior thyroid ultrasound 01/01/2017; remote
thyroid ultrasound 12/26/2012

CLINICAL DATA: Goiter. 46-year-old female with a history of
multinodular goiter. She has undergone prior left hemithyroidectomy
as well as biopsy of the right lower pole thyroid nodule on
01/20/2015.

EXAM:
THYROID ULTRASOUND
TECHNIQUE: Ultrasound examination of the thyroid gland and adjacent soft
tissues was performed.

[Series 1: us thyroid · 0.06mm/px · 13 of 62 slices shown]
[im 1/62]
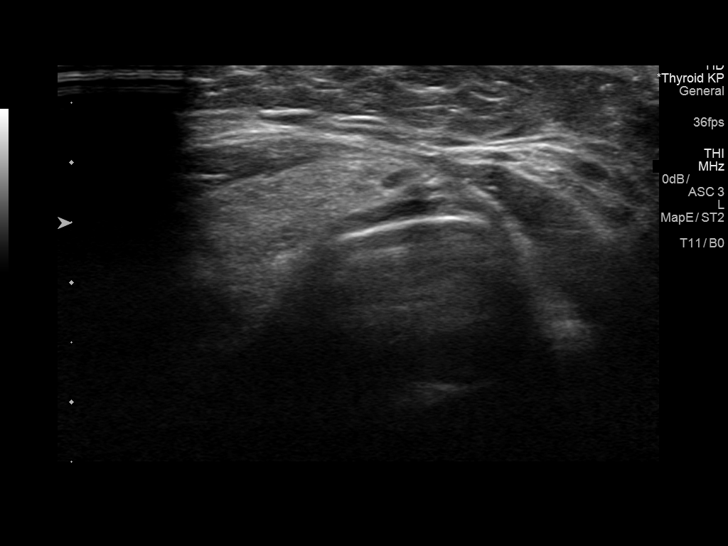
[im 6/62]
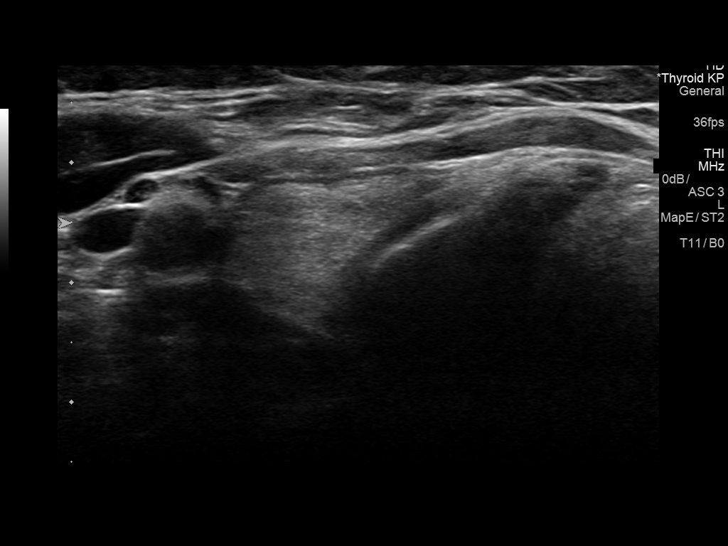
[im 11/62]
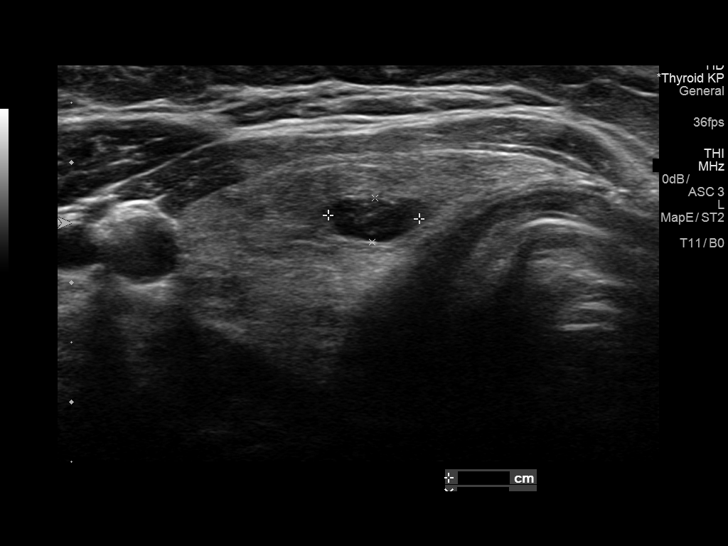
[im 16/62]
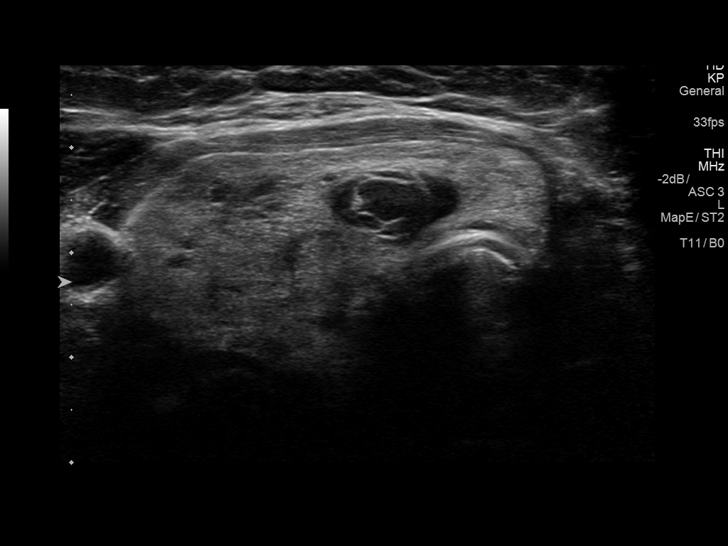
[im 21/62]
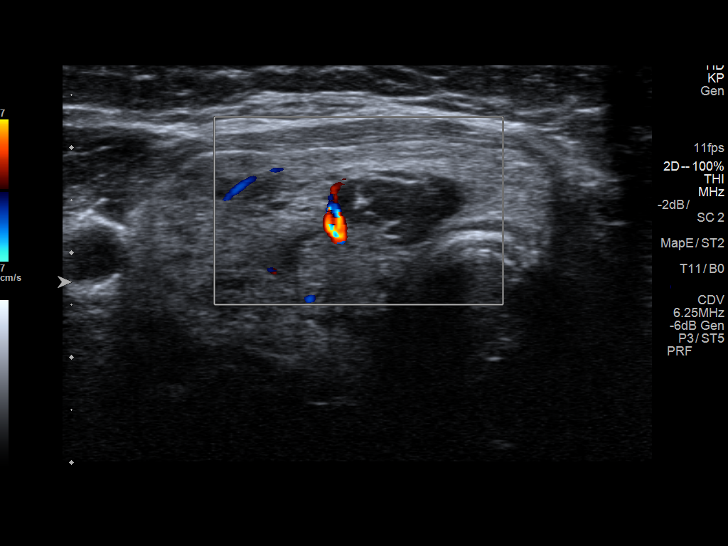
[im 26/62]
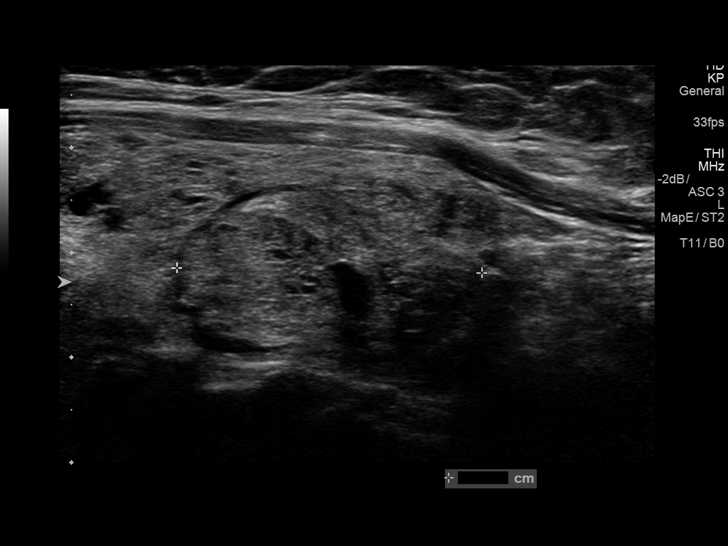
[im 31/62]
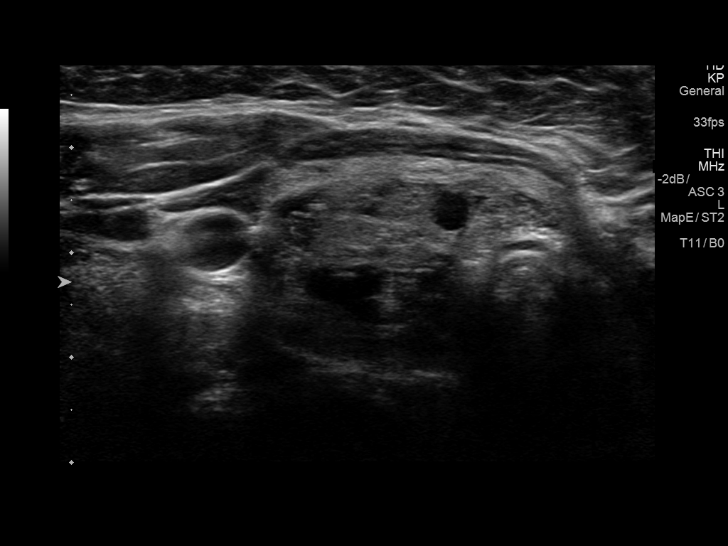
[im 36/62]
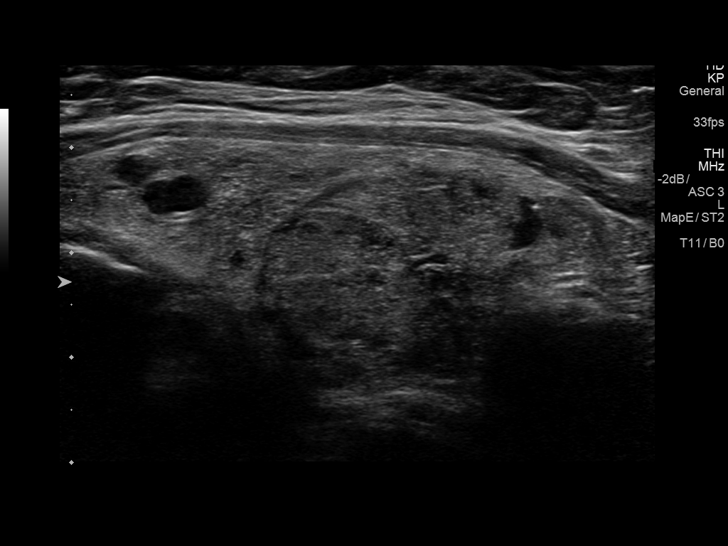
[im 41/62]
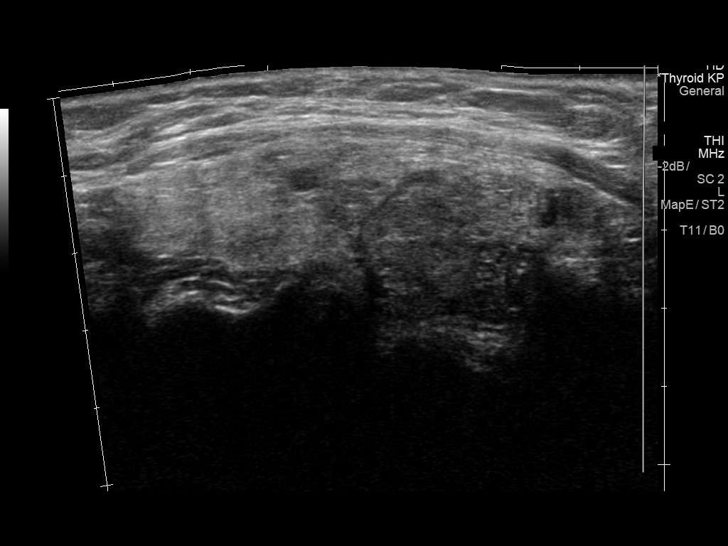
[im 46/62]
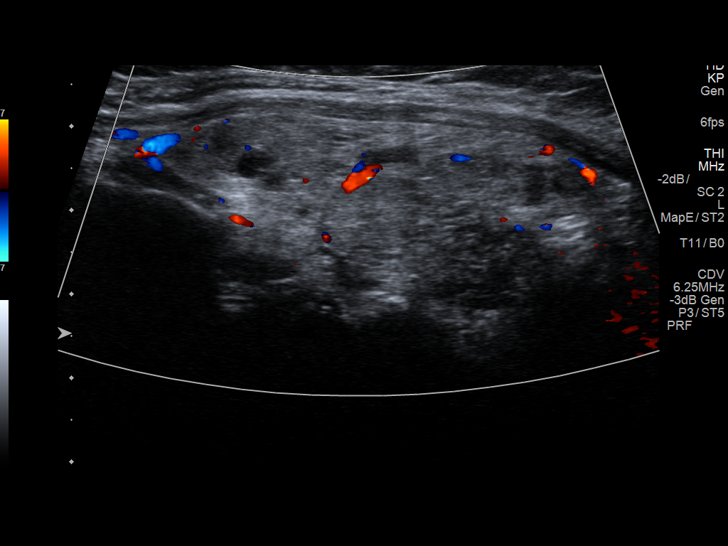
[im 51/62]
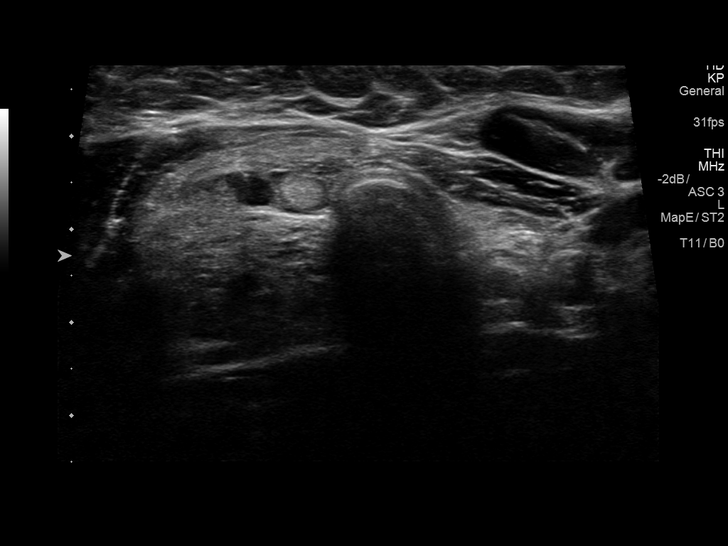
[im 56/62]
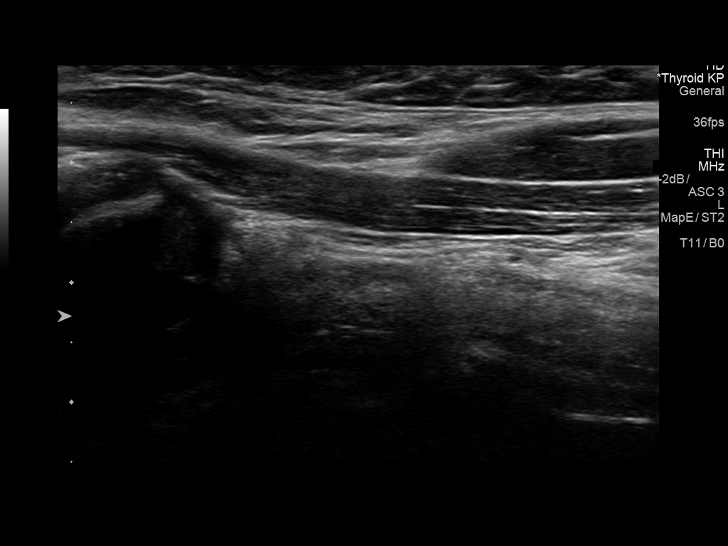
[im 62/62]
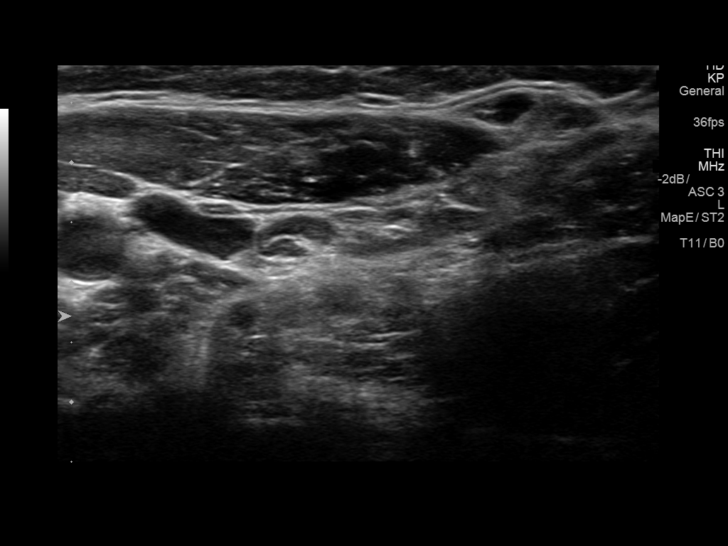

[13 of 25 positions shown; findings below may reference images not displayed]

FINDINGS: Parenchymal Echotexture: Moderately heterogenous

Isthmus: 0.7 cm

Right lobe: 6.3 x 2.3 x 2.8 cm

Left lobe: Surgically absent

_________________________________________________________

Estimated total number of nodules >/= 1 cm: 2

Number of spongiform nodules >/=  2 cm not described below (TR1): 0

Number of mixed cystic and solid nodules >/= 1.5 cm not described
below (TR2): 0

_________________________________________________________

The previously biopsied mass in the inferior gland measures 3.1 x
1.7 x 2.2 cm. This is slightly smaller than previously measured at
3.3 x 1.7 x 2.4 cm. No new or suspicious features. Multiple
sonographically benign cysts and mixed cystic and solid nodules
scattered throughout the right mid gland. No nodules meet criteria
for biopsy or continued imaging surveillance.
IMPRESSION: Slight interval involution of the previously biopsied nodule in the
right inferior gland consistent with benignity.

The above is in keeping with the ACR TI-RADS recommendations - [HOSPITAL] 7739;[DATE].

## 2018-10-09 DIAGNOSIS — R1084 Generalized abdominal pain: Secondary | ICD-10-CM | POA: Diagnosis not present

## 2018-10-09 DIAGNOSIS — R197 Diarrhea, unspecified: Secondary | ICD-10-CM | POA: Diagnosis not present

## 2018-10-09 DIAGNOSIS — R110 Nausea: Secondary | ICD-10-CM | POA: Diagnosis not present

## 2018-10-11 MED FILL — RABEPRAZOLE SOD DR 20 MG TA: 20 | 90 days supply | Qty: 180 | Fill #3

## 2018-10-16 DIAGNOSIS — R12 Heartburn: Secondary | ICD-10-CM | POA: Diagnosis not present

## 2018-10-16 DIAGNOSIS — R110 Nausea: Secondary | ICD-10-CM | POA: Diagnosis not present

## 2018-10-16 MED FILL — DRONABINOL 2.5 MG CAPSULE: 2.5 | 30 days supply | Qty: 60 | Fill #2

## 2018-10-21 DIAGNOSIS — R12 Heartburn: Secondary | ICD-10-CM | POA: Diagnosis not present

## 2018-10-21 DIAGNOSIS — R110 Nausea: Secondary | ICD-10-CM | POA: Diagnosis not present

## 2018-10-23 DIAGNOSIS — K219 Gastro-esophageal reflux disease without esophagitis: Secondary | ICD-10-CM | POA: Diagnosis not present

## 2018-10-28 MED FILL — DICYCLOMINE 10 MG CAPSULE: 10 | 90 days supply | Qty: 270 | Fill #0

## 2018-10-30 DIAGNOSIS — R12 Heartburn: Secondary | ICD-10-CM | POA: Diagnosis not present

## 2018-10-30 DIAGNOSIS — R110 Nausea: Secondary | ICD-10-CM | POA: Diagnosis not present

## 2018-11-05 MED FILL — LEVOTHYROXINE 25 MCG TABLET: 25 | 90 days supply | Qty: 90 | Fill #0

## 2018-12-18 DIAGNOSIS — K5900 Constipation, unspecified: Secondary | ICD-10-CM | POA: Diagnosis not present

## 2018-12-20 MED FILL — METOCLOPRAMIDE 5 MG TABLET: 5 | 90 days supply | Qty: 90 | Fill #0

## 2019-01-07 DIAGNOSIS — E041 Nontoxic single thyroid nodule: Secondary | ICD-10-CM | POA: Diagnosis not present

## 2019-01-09 MED FILL — RABEPRAZOLE SOD DR 20 MG TA: 20 | 90 days supply | Qty: 180 | Fill #0

## 2019-01-27 DIAGNOSIS — R110 Nausea: Secondary | ICD-10-CM | POA: Diagnosis not present

## 2019-01-27 DIAGNOSIS — R12 Heartburn: Secondary | ICD-10-CM | POA: Diagnosis not present

## 2019-01-27 MED FILL — LEVOTHYROXINE 25 MCG TABLET: 25 | 90 days supply | Qty: 90 | Fill #0

## 2019-01-27 MED FILL — DRONABINOL 2.5 MG CAPSULE: 2.5 | 30 days supply | Qty: 60 | Fill #0

## 2019-02-07 DIAGNOSIS — R110 Nausea: Secondary | ICD-10-CM | POA: Diagnosis not present

## 2019-02-07 DIAGNOSIS — R12 Heartburn: Secondary | ICD-10-CM | POA: Diagnosis not present

## 2019-02-17 DIAGNOSIS — R12 Heartburn: Secondary | ICD-10-CM | POA: Diagnosis not present

## 2019-02-17 DIAGNOSIS — R110 Nausea: Secondary | ICD-10-CM | POA: Diagnosis not present

## 2019-02-24 DIAGNOSIS — R110 Nausea: Secondary | ICD-10-CM | POA: Diagnosis not present

## 2019-02-24 DIAGNOSIS — R12 Heartburn: Secondary | ICD-10-CM | POA: Diagnosis not present

## 2019-03-03 DIAGNOSIS — R110 Nausea: Secondary | ICD-10-CM | POA: Diagnosis not present

## 2019-03-03 DIAGNOSIS — R12 Heartburn: Secondary | ICD-10-CM | POA: Diagnosis not present

## 2019-03-10 DIAGNOSIS — R110 Nausea: Secondary | ICD-10-CM | POA: Diagnosis not present

## 2019-03-10 DIAGNOSIS — R12 Heartburn: Secondary | ICD-10-CM | POA: Diagnosis not present

## 2019-03-10 MED FILL — PYRIDOSTIGMINE BR 60 MG TAB: 60 | 30 days supply | Qty: 45 | Fill #2

## 2019-03-10 MED FILL — TRETINOIN 0.025% CREAM: 0.025 | 25 days supply | Qty: 20 | Fill #2

## 2019-03-24 DIAGNOSIS — R110 Nausea: Secondary | ICD-10-CM | POA: Diagnosis not present

## 2019-03-24 DIAGNOSIS — R12 Heartburn: Secondary | ICD-10-CM | POA: Diagnosis not present

## 2019-03-24 MED FILL — METOCLOPRAMIDE 5 MG TABLET: 5 | 90 days supply | Qty: 90 | Fill #0

## 2019-03-25 MED FILL — DICYCLOMINE 10 MG CAPSULE: 10 | 90 days supply | Qty: 270 | Fill #1

## 2019-03-31 DIAGNOSIS — R12 Heartburn: Secondary | ICD-10-CM | POA: Diagnosis not present

## 2019-03-31 DIAGNOSIS — R110 Nausea: Secondary | ICD-10-CM | POA: Diagnosis not present

## 2019-04-02 DIAGNOSIS — K5900 Constipation, unspecified: Secondary | ICD-10-CM | POA: Diagnosis not present

## 2019-04-02 DIAGNOSIS — K3184 Gastroparesis: Secondary | ICD-10-CM | POA: Diagnosis not present

## 2019-04-03 MED FILL — PYRIDOSTIGMINE BR 60 MG TAB: 60 | 30 days supply | Qty: 90 | Fill #0

## 2019-04-07 DIAGNOSIS — R110 Nausea: Secondary | ICD-10-CM | POA: Diagnosis not present

## 2019-04-07 DIAGNOSIS — R12 Heartburn: Secondary | ICD-10-CM | POA: Diagnosis not present

## 2019-04-14 DIAGNOSIS — R110 Nausea: Secondary | ICD-10-CM | POA: Diagnosis not present

## 2019-04-14 DIAGNOSIS — R12 Heartburn: Secondary | ICD-10-CM | POA: Diagnosis not present

## 2019-04-15 DIAGNOSIS — Z803 Family history of malignant neoplasm of breast: Secondary | ICD-10-CM | POA: Diagnosis not present

## 2019-04-15 DIAGNOSIS — Z1231 Encounter for screening mammogram for malignant neoplasm of breast: Secondary | ICD-10-CM | POA: Diagnosis not present

## 2019-04-15 DIAGNOSIS — Z6832 Body mass index (BMI) 32.0-32.9, adult: Secondary | ICD-10-CM | POA: Diagnosis not present

## 2019-04-15 DIAGNOSIS — Z01419 Encounter for gynecological examination (general) (routine) without abnormal findings: Secondary | ICD-10-CM | POA: Diagnosis not present

## 2019-04-21 DIAGNOSIS — R12 Heartburn: Secondary | ICD-10-CM | POA: Diagnosis not present

## 2019-04-21 DIAGNOSIS — R110 Nausea: Secondary | ICD-10-CM | POA: Diagnosis not present

## 2019-04-29 DIAGNOSIS — R12 Heartburn: Secondary | ICD-10-CM | POA: Diagnosis not present

## 2019-04-29 DIAGNOSIS — R110 Nausea: Secondary | ICD-10-CM | POA: Diagnosis not present

## 2019-05-06 DIAGNOSIS — R110 Nausea: Secondary | ICD-10-CM | POA: Diagnosis not present

## 2019-05-06 DIAGNOSIS — R12 Heartburn: Secondary | ICD-10-CM | POA: Diagnosis not present

## 2019-05-07 MED FILL — LEVOTHYROXINE 25 MCG TABLET: 25 | 90 days supply | Qty: 90 | Fill #1

## 2019-05-12 ENCOUNTER — Other Ambulatory Visit: Payer: Self-pay

## 2019-05-12 ENCOUNTER — Ambulatory Visit (INDEPENDENT_AMBULATORY_CARE_PROVIDER_SITE_OTHER): Payer: 59 | Admitting: Family Medicine

## 2019-05-12 ENCOUNTER — Encounter: Payer: Self-pay | Admitting: Family Medicine

## 2019-05-12 VITALS — BP 118/82 | HR 78 | Temp 98.2°F | Resp 16 | Ht 63.25 in | Wt 178.6 lb

## 2019-05-12 DIAGNOSIS — K3184 Gastroparesis: Secondary | ICD-10-CM | POA: Diagnosis not present

## 2019-05-12 DIAGNOSIS — Z0000 Encounter for general adult medical examination without abnormal findings: Secondary | ICD-10-CM | POA: Diagnosis not present

## 2019-05-12 DIAGNOSIS — E559 Vitamin D deficiency, unspecified: Secondary | ICD-10-CM

## 2019-05-12 DIAGNOSIS — K219 Gastro-esophageal reflux disease without esophagitis: Secondary | ICD-10-CM

## 2019-05-12 DIAGNOSIS — K580 Irritable bowel syndrome with diarrhea: Secondary | ICD-10-CM | POA: Diagnosis not present

## 2019-05-12 DIAGNOSIS — Z803 Family history of malignant neoplasm of breast: Secondary | ICD-10-CM

## 2019-05-12 DIAGNOSIS — E042 Nontoxic multinodular goiter: Secondary | ICD-10-CM | POA: Diagnosis not present

## 2019-05-12 DIAGNOSIS — Z23 Encounter for immunization: Secondary | ICD-10-CM | POA: Diagnosis not present

## 2019-05-12 HISTORY — DX: Family history of malignant neoplasm of breast: Z80.3

## 2019-05-12 LAB — COMPREHENSIVE METABOLIC PANEL
ALT: 22 U/L (ref 0–35)
AST: 34 U/L (ref 0–37)
Albumin: 4.3 g/dL (ref 3.5–5.2)
Alkaline Phosphatase: 68 U/L (ref 39–117)
BUN: 7 mg/dL (ref 6–23)
CO2: 25 mEq/L (ref 19–32)
Calcium: 9.6 mg/dL (ref 8.4–10.5)
Chloride: 104 mEq/L (ref 96–112)
Creatinine, Ser: 0.94 mg/dL (ref 0.40–1.20)
GFR: 63.6 mL/min (ref >60.00)
Glucose, Bld: 110 mg/dL — ABNORMAL HIGH (ref 70–99)
Potassium: 4 mEq/L (ref 3.5–5.1)
Sodium: 138 mEq/L (ref 135–145)
Total Bilirubin: 0.8 mg/dL (ref 0.2–1.2)
Total Protein: 7.8 g/dL (ref 6.0–8.3)

## 2019-05-12 LAB — LIPID PANEL
Cholesterol: 198 mg/dL (ref 0–200)
HDL: 40.7 mg/dL (ref >39.00)
LDL Cholesterol: 122 mg/dL — ABNORMAL HIGH (ref 0–99)
NonHDL: 157.38
Total CHOL/HDL Ratio: 5
Triglycerides: 175 mg/dL — ABNORMAL HIGH (ref 0.0–149.0)
VLDL: 35 mg/dL (ref 0.0–40.0)

## 2019-05-12 LAB — CBC WITH DIFFERENTIAL/PLATELET
Basophils Absolute: 0.1 10*3/uL (ref 0.0–0.1)
Basophils Relative: 1.2 % (ref 0.0–3.0)
Eosinophils Absolute: 0.1 10*3/uL (ref 0.0–0.7)
Eosinophils Relative: 0.6 % (ref 0.0–5.0)
HCT: 42.6 % (ref 36.0–46.0)
Hemoglobin: 14 g/dL (ref 12.0–15.0)
Lymphocytes Relative: 29.2 % (ref 12.0–46.0)
Lymphs Abs: 2.9 10*3/uL (ref 0.7–4.0)
MCHC: 32.9 g/dL (ref 30.0–36.0)
MCV: 82.7 fl (ref 78.0–100.0)
Monocytes Absolute: 0.7 10*3/uL (ref 0.1–1.0)
Monocytes Relative: 6.7 % (ref 3.0–12.0)
Neutro Abs: 6.1 10*3/uL (ref 1.4–7.7)
Neutrophils Relative %: 62.3 % (ref 43.0–77.0)
Platelets: 222 10*3/uL (ref 150.0–400.0)
RBC: 5.15 Mil/uL — ABNORMAL HIGH (ref 3.87–5.11)
RDW: 14.4 % (ref 11.5–15.5)
WBC: 9.8 10*3/uL (ref 4.0–10.5)

## 2019-05-12 LAB — TSH: TSH: 1.03 u[IU]/mL (ref 0.35–4.50)

## 2019-05-12 LAB — VITAMIN D 25 HYDROXY (VIT D DEFICIENCY, FRACTURES): VITD: 67.61 ng/mL (ref 30.00–100.00)

## 2019-05-12 NOTE — Patient Instructions (Signed)
Please return in 12 months for your annual complete physical; please come fasting.   I will release your lab results to you on your MyChart account with further instructions. Please reply with any questions.    Today you were given your flu vaccination.    If you have any questions or concerns, please don't hesitate to send me a message via MyChart or call the office at [phone]. Thank you for visiting with us today! It's our pleasure caring for you.   Please do these things to maintain good health!  Exercise at least 30-45 minutes a day,  4-5 days a week.  Eat a low-fat diet with lots of fruits and vegetables, up to 7-9 servings per day. Drink plenty of water daily. Try to drink 8 8oz glasses per day. Seatbelts can save your life. Always wear your seatbelt. Place Smoke Detectors on every level of your home and check batteries every year. Schedule an appointment with an eye doctor for an eye exam every 1-2 years Safe sex - use condoms to protect yourself from STDs if you could be exposed to these types of infections. Use birth control if you do not want to become pregnant and are sexually active. Avoid heavy alcohol use. If you drink, keep it to less than 2 drinks/day and not every day. Health Care Power of Attorney.  Choose someone you trust that could speak for you if you became unable to speak for yourself. Depression is common in our stressful world.If you're feeling down or losing interest in things you normally enjoy, please come in for a visit. If anyone is threatening or hurting you, please get help. Physical or Emotional Violence is never OK.   

## 2019-05-12 NOTE — Progress Notes (Signed)
Subjective  Chief Complaint  Patient presents with   Annual Exam    Fasting    HPI: Karen [name] is a 48 y.o. female who presents to [facility] at [place] today for a Female Wellness Visit.   Wellness Visit: annual visit with health maintenance review and exam without Pap; had recent mammo and nl pap and [facility]; (had partial hysterectomy; still has ovaries and cervix)   HM: all up to date. Flu shot today.  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 Had recent nl mammo; has had breast MRI as well. Had recent genetic testing; awaiting results.   Dr. Chalmers Cater is managing her thyroid nodules; reportedly stable.   Assessment  1. Annual physical exam   2. Gastroesophageal reflux disease without esophagitis   3. Gastroparesis   4. Irritable bowel syndrome with diarrhea   5. Multinodular goiter   6. Vitamin D deficiency   7. Need for immunization against influenza   8. Family history of breast cancer      Plan  Female Wellness Visit:  Age appropriate Health Maintenance and Prevention measures were discussed with patient. Included topics are cancer screening recommendations, ways to keep healthy (see AVS) including dietary and exercise recommendations, regular eye and dental care, use of seat belts, and avoidance of moderate alcohol use and tobacco use. Screens are up to date.   BMI: discussed patient's BMI and encouraged positive lifestyle modifications to help get to or maintain a target BMI.  HM needs and immunizations were addressed and ordered. See below for orders. See HM and immunization section for updates. Flu shot today  Routine labs and screening tests ordered including cmp, cbc and lipids where appropriate.  Discussed recommendations regarding Vit D and calcium supplementation (see AVS)  Chronic problems are stable. Check labs. No med changes  today.   Follow up: recheck 1 year for cpe   Orders Placed This Encounter  Procedures   Flu Vaccine QUAD 36+ mos IM   Comprehensive metabolic panel   CBC with Differential/Platelet   Lipid panel   TSH   VITAMIN D 25 Hydroxy (Vit-D Deficiency, Fractures)   No orders of the defined types were placed in this encounter.    Lifestyle: Body mass index is 31.39 kg/m. Wt Readings from Last 3 Encounters:  05/12/19 178 lb 9.6 oz (81 kg)  05/07/18 177 lb 6.4 oz (80.5 kg)  02/18/18 181 lb 3.2 oz (82.2 kg)     Patient Active Problem List   Diagnosis Date Noted   Family history of breast cancer 05/12/2019    Mother passed age 17 with recurrent breast cancer, metastatic.  Pt had gene testing 04/2019    Vitamin D deficiency 02/18/2018   Milia of eyelid of right eye 02/18/2018   Esophageal dysmotilities 02/18/2018   IBS (irritable bowel syndrome) 02/18/2018   Multinodular goiter 02/18/2018    Had partial thyroidectomy 1996; managed now by Dr. Chalmers Cater; monitoring for now; had recent biopsy.     Leukocytosis 12/18/2014   Chronic allergic rhinitis 03/09/2010   Gastroparesis 10/18/2009    Qualifier: Diagnosis of  By: Larose Kells MD, Alda Berthold.     Gastroesophageal reflux disease without esophagitis 08/06/2007    Qualifier: Diagnosis of  By: Larose Kells MD, [place] Maintenance  Topic Date Due   INFLUENZA VACCINE  03/15/2019   MAMMOGRAM  03/14/2020   PAP  SMEAR-Modifier  04/14/2022   TETANUS/TDAP  02/19/2028   HIV Screening  Discontinued   Immunization History  Administered Date(s) Administered   Influenza,inj,Quad PF,6+ Mos 05/02/2017, 05/07/2018, 05/12/2019   Tdap 02/18/2018   We updated and reviewed the patient's past history in detail and it is documented below. Allergies: Patient is allergic to onion; hops oil; and hydrocortisone. Past Medical History Patient  has a past medical history of Chronic nausea, Family history of breast cancer (05/12/2019),  Gastroparesis, GERD (gastroesophageal reflux disease), History of multinodular goiter (02/18/2018), Hypothyroidism, IBS (irritable bowel syndrome), PONV (postoperative nausea and vomiting), Seasonal allergies, SVD (spontaneous vaginal delivery), and Thyroid nodule. Past Surgical History Patient  has a past surgical history that includes Thyroidectomy, partial; Cholecystectomy; Wisdom tooth extraction; Eye surgery; and Laparoscopic assisted vaginal hysterectomy (09/12/2012). Family History: Patient family history includes Breast cancer in her mother; Cancer in her maternal grandmother; Diabetes in her father; Healthy in her daughter and daughter; Heart attack in her paternal grandfather. Social History:  Patient  reports that she has never smoked. She has never used smokeless tobacco. She reports that she does not drink alcohol or use drugs.  Review of Systems: Constitutional: negative for fever or malaise Ophthalmic: negative for photophobia, double vision or loss of vision Cardiovascular: negative for chest pain, dyspnea on exertion, or new LE swelling Respiratory: negative for SOB or persistent cough Gastrointestinal: negative for abdominal pain, change in bowel habits or melena Genitourinary: negative for dysuria or gross hematuria, no abnormal uterine bleeding or disharge Musculoskeletal: negative for new gait disturbance or muscular weakness Integumentary: negative for new or persistent rashes, no breast lumps Neurological: negative for TIA or stroke symptoms Psychiatric: negative for SI or delusions Allergic/Immunologic: negative for hives  Patient Care Team    Relationship Specialty Notifications Start End  Leamon Arnt, MD PCP - General Family Medicine  02/18/18   Scherrie November, MD Referring Physician Gastroenterology  06/19/16   Jacelyn Pi, MD Consulting Physician Endocrinology  02/18/18   Louretta Shorten, MD Consulting Physician Obstetrics and Gynecology  02/18/18     Objective    Vitals: BP 118/82    Pulse 78    Temp 98.2 F (36.8 C) (Tympanic)    Resp 16    Ht 5' 3.25" (1.607 m)    Wt 178 lb 9.6 oz (81 kg)    LMP 08/08/2012    SpO2 97%    BMI 31.39 kg/m  General:  Well developed, well nourished, no acute distress  Psych:  Alert and orientedx3,normal mood and affect HEENT:  Normocephalic, atraumatic, non-icteric sclera, PERRL, oropharynx is clear without mass or exudate, supple neck without adenopathy, mass or thyromegaly Cardiovascular:  Normal S1, S2, RRR without gallop, rub or murmur, nondisplaced PMI Respiratory:  Good breath sounds bilaterally, CTAB with normal respiratory effort Gastrointestinal: normal bowel sounds, soft, non-tender, no noted masses. No HSM MSK: no deformities, contusions. Joints are without erythema or swelling. Spine and CVA region are nontender Skin:  Warm, no rashes or suspicious lesions noted Neurologic:    Mental status is normal. CN 2-11 are normal. Gross motor and sensory exams are normal. Normal gait. No tremor   Commons side effects, risks, benefits, and alternatives for medications and treatment plan prescribed today were discussed, and the patient expressed understanding of the given instructions. Patient is instructed to call or message via MyChart if he/she has any questions or concerns regarding our treatment plan. No barriers to understanding were identified. We discussed Red Flag symptoms and signs in detail.  Patient expressed understanding regarding what to do in case of urgent or emergency type symptoms.   Medication list was reconciled, printed and provided to the patient in AVS. Patient instructions and summary information was reviewed with the patient as documented in the AVS. This note was prepared with assistance of Dragon voice recognition software. Occasional wrong-word or sound-a-like substitutions may have occurred due to the inherent limitations of voice recognition software

## 2019-05-13 ENCOUNTER — Encounter: Payer: Self-pay | Admitting: Family Medicine

## 2019-05-13 DIAGNOSIS — R7301 Impaired fasting glucose: Secondary | ICD-10-CM | POA: Insufficient documentation

## 2019-05-13 DIAGNOSIS — R12 Heartburn: Secondary | ICD-10-CM | POA: Diagnosis not present

## 2019-05-13 DIAGNOSIS — R110 Nausea: Secondary | ICD-10-CM | POA: Diagnosis not present

## 2019-05-20 DIAGNOSIS — R12 Heartburn: Secondary | ICD-10-CM | POA: Diagnosis not present

## 2019-05-20 DIAGNOSIS — R110 Nausea: Secondary | ICD-10-CM | POA: Diagnosis not present

## 2019-05-26 MED FILL — RABEPRAZOLE SOD DR 20 MG TA: 20 | 90 days supply | Qty: 180 | Fill #1

## 2019-05-26 MED FILL — PYRIDOSTIGMINE BR 60 MG TAB: 60 | 30 days supply | Qty: 90 | Fill #1

## 2019-05-27 DIAGNOSIS — R110 Nausea: Secondary | ICD-10-CM | POA: Diagnosis not present

## 2019-05-27 DIAGNOSIS — R12 Heartburn: Secondary | ICD-10-CM | POA: Diagnosis not present

## 2019-05-29 DIAGNOSIS — Z803 Family history of malignant neoplasm of breast: Secondary | ICD-10-CM | POA: Diagnosis not present

## 2019-06-02 ENCOUNTER — Other Ambulatory Visit (HOSPITAL_COMMUNITY): Payer: Self-pay | Admitting: Obstetrics and Gynecology

## 2019-06-02 ENCOUNTER — Other Ambulatory Visit: Payer: Self-pay | Admitting: Obstetrics and Gynecology

## 2019-06-02 DIAGNOSIS — Z803 Family history of malignant neoplasm of breast: Secondary | ICD-10-CM

## 2019-06-03 DIAGNOSIS — R12 Heartburn: Secondary | ICD-10-CM | POA: Diagnosis not present

## 2019-06-03 DIAGNOSIS — R110 Nausea: Secondary | ICD-10-CM | POA: Diagnosis not present

## 2019-06-10 DIAGNOSIS — R12 Heartburn: Secondary | ICD-10-CM | POA: Diagnosis not present

## 2019-06-10 DIAGNOSIS — R110 Nausea: Secondary | ICD-10-CM | POA: Diagnosis not present

## 2019-06-23 ENCOUNTER — Other Ambulatory Visit: Payer: Self-pay

## 2019-06-23 ENCOUNTER — Ambulatory Visit
Admission: RE | Admit: 2019-06-23 | Discharge: 2019-06-23 | Disposition: A | Discharge status: Home / Self Care | Payer: 59 | Source: Ambulatory Visit | Attending: Obstetrics and Gynecology | Admitting: Obstetrics and Gynecology

## 2019-06-23 DIAGNOSIS — N6489 Other specified disorders of breast: Secondary | ICD-10-CM | POA: Diagnosis not present

## 2019-06-23 DIAGNOSIS — Z803 Family history of malignant neoplasm of breast: Secondary | ICD-10-CM

## 2019-06-23 MED ORDER — GADOBUTROL 1 MMOL/ML IV SOLN
8.0000 mL | Freq: Once | INTRAVENOUS | Status: AC | PRN
  Administered 2019-06-23: 8 mL via INTRAVENOUS

## 2019-06-23 MED FILL — METOCLOPRAMIDE 5 MG TABLET: 5 | 90 days supply | Qty: 90 | Fill #0

## 2019-07-01 DIAGNOSIS — R12 Heartburn: Secondary | ICD-10-CM | POA: Diagnosis not present

## 2019-07-01 DIAGNOSIS — R110 Nausea: Secondary | ICD-10-CM | POA: Diagnosis not present

## 2019-07-08 DIAGNOSIS — R12 Heartburn: Secondary | ICD-10-CM | POA: Diagnosis not present

## 2019-07-08 DIAGNOSIS — R110 Nausea: Secondary | ICD-10-CM | POA: Diagnosis not present

## 2019-07-15 DIAGNOSIS — R12 Heartburn: Secondary | ICD-10-CM | POA: Diagnosis not present

## 2019-07-15 DIAGNOSIS — R110 Nausea: Secondary | ICD-10-CM | POA: Diagnosis not present

## 2019-07-22 DIAGNOSIS — R12 Heartburn: Secondary | ICD-10-CM | POA: Diagnosis not present

## 2019-07-22 DIAGNOSIS — R110 Nausea: Secondary | ICD-10-CM | POA: Diagnosis not present

## 2019-07-29 DIAGNOSIS — R12 Heartburn: Secondary | ICD-10-CM | POA: Diagnosis not present

## 2019-07-29 DIAGNOSIS — R110 Nausea: Secondary | ICD-10-CM | POA: Diagnosis not present

## 2019-08-04 MED FILL — DICYCLOMINE 10 MG CAPSULE: 10 | 90 days supply | Qty: 270 | Fill #0

## 2019-08-04 MED FILL — LEVOTHYROXINE 25 MCG TABLET: 25 | 90 days supply | Qty: 90 | Fill #2

## 2019-08-05 DIAGNOSIS — R12 Heartburn: Secondary | ICD-10-CM | POA: Diagnosis not present

## 2019-08-05 DIAGNOSIS — R110 Nausea: Secondary | ICD-10-CM | POA: Diagnosis not present

## 2019-08-12 DIAGNOSIS — R110 Nausea: Secondary | ICD-10-CM | POA: Diagnosis not present

## 2019-08-12 DIAGNOSIS — R12 Heartburn: Secondary | ICD-10-CM | POA: Diagnosis not present

## 2019-08-19 DIAGNOSIS — R110 Nausea: Secondary | ICD-10-CM | POA: Diagnosis not present

## 2019-08-19 DIAGNOSIS — R12 Heartburn: Secondary | ICD-10-CM | POA: Diagnosis not present

## 2019-08-21 MED FILL — RABEPRAZOLE SOD DR 20 MG TA: 20 | 90 days supply | Qty: 180 | Fill #2

## 2019-08-25 ENCOUNTER — Encounter: Payer: Self-pay | Admitting: Family Medicine

## 2019-08-26 DIAGNOSIS — R110 Nausea: Secondary | ICD-10-CM | POA: Diagnosis not present

## 2019-08-26 DIAGNOSIS — R12 Heartburn: Secondary | ICD-10-CM | POA: Diagnosis not present

## 2019-09-02 DIAGNOSIS — R110 Nausea: Secondary | ICD-10-CM | POA: Diagnosis not present

## 2019-09-02 DIAGNOSIS — R12 Heartburn: Secondary | ICD-10-CM | POA: Diagnosis not present

## 2019-09-16 DIAGNOSIS — R110 Nausea: Secondary | ICD-10-CM | POA: Diagnosis not present

## 2019-09-16 DIAGNOSIS — R12 Heartburn: Secondary | ICD-10-CM | POA: Diagnosis not present

## 2019-09-20 MED FILL — METOCLOPRAMIDE 5 MG TABLET: 5 | 90 days supply | Qty: 90 | Fill #0

## 2019-09-23 DIAGNOSIS — R12 Heartburn: Secondary | ICD-10-CM | POA: Diagnosis not present

## 2019-09-23 DIAGNOSIS — R110 Nausea: Secondary | ICD-10-CM | POA: Diagnosis not present

## 2019-09-29 MED FILL — DRONABINOL 2.5 MG CAPSULE: 2.5 | 30 days supply | Qty: 60 | Fill #0

## 2019-09-30 DIAGNOSIS — R12 Heartburn: Secondary | ICD-10-CM | POA: Diagnosis not present

## 2019-09-30 DIAGNOSIS — R110 Nausea: Secondary | ICD-10-CM | POA: Diagnosis not present

## 2019-10-01 DIAGNOSIS — R110 Nausea: Secondary | ICD-10-CM | POA: Diagnosis not present

## 2019-10-01 DIAGNOSIS — K581 Irritable bowel syndrome with constipation: Secondary | ICD-10-CM | POA: Diagnosis not present

## 2019-10-01 DIAGNOSIS — K580 Irritable bowel syndrome with diarrhea: Secondary | ICD-10-CM | POA: Diagnosis not present

## 2019-10-01 DIAGNOSIS — K219 Gastro-esophageal reflux disease without esophagitis: Secondary | ICD-10-CM | POA: Diagnosis not present

## 2019-10-07 DIAGNOSIS — R110 Nausea: Secondary | ICD-10-CM | POA: Diagnosis not present

## 2019-10-07 DIAGNOSIS — R12 Heartburn: Secondary | ICD-10-CM | POA: Diagnosis not present

## 2019-10-14 DIAGNOSIS — R12 Heartburn: Secondary | ICD-10-CM | POA: Diagnosis not present

## 2019-10-14 DIAGNOSIS — R110 Nausea: Secondary | ICD-10-CM | POA: Diagnosis not present

## 2019-10-21 DIAGNOSIS — R110 Nausea: Secondary | ICD-10-CM | POA: Diagnosis not present

## 2019-10-21 DIAGNOSIS — R12 Heartburn: Secondary | ICD-10-CM | POA: Diagnosis not present

## 2019-10-28 DIAGNOSIS — R12 Heartburn: Secondary | ICD-10-CM | POA: Diagnosis not present

## 2019-10-28 DIAGNOSIS — R110 Nausea: Secondary | ICD-10-CM | POA: Diagnosis not present

## 2019-11-03 MED FILL — LEVOTHYROXINE SODIUM 25 MCG: 25 | 90 days supply | Qty: 90 | Fill #3

## 2019-11-03 MED FILL — RABEPRAZOLE SOD DR 20 MG TA: 20 | 90 days supply | Qty: 180 | Fill #3

## 2019-11-04 DIAGNOSIS — R110 Nausea: Secondary | ICD-10-CM | POA: Diagnosis not present

## 2019-11-04 DIAGNOSIS — R12 Heartburn: Secondary | ICD-10-CM | POA: Diagnosis not present

## 2019-11-25 DIAGNOSIS — R110 Nausea: Secondary | ICD-10-CM | POA: Diagnosis not present

## 2019-11-25 DIAGNOSIS — R12 Heartburn: Secondary | ICD-10-CM | POA: Diagnosis not present

## 2019-12-01 ENCOUNTER — Other Ambulatory Visit (HOSPITAL_COMMUNITY): Payer: Self-pay | Admitting: Gastroenterology

## 2019-12-01 MED FILL — ONDANSETRON ODT 4 MG TABLET: 4 | 30 days supply | Qty: 90 | Fill #0

## 2019-12-02 DIAGNOSIS — R110 Nausea: Secondary | ICD-10-CM | POA: Diagnosis not present

## 2019-12-02 DIAGNOSIS — R12 Heartburn: Secondary | ICD-10-CM | POA: Diagnosis not present

## 2019-12-09 DIAGNOSIS — R110 Nausea: Secondary | ICD-10-CM | POA: Diagnosis not present

## 2019-12-09 DIAGNOSIS — R12 Heartburn: Secondary | ICD-10-CM | POA: Diagnosis not present

## 2019-12-15 ENCOUNTER — Other Ambulatory Visit: Payer: Self-pay | Admitting: Endocrinology

## 2019-12-15 DIAGNOSIS — E041 Nontoxic single thyroid nodule: Secondary | ICD-10-CM

## 2019-12-16 DIAGNOSIS — R12 Heartburn: Secondary | ICD-10-CM | POA: Diagnosis not present

## 2019-12-16 DIAGNOSIS — R110 Nausea: Secondary | ICD-10-CM | POA: Diagnosis not present

## 2019-12-24 ENCOUNTER — Ambulatory Visit
Admission: RE | Admit: 2019-12-24 | Discharge: 2019-12-24 | Disposition: A | Discharge status: Home / Self Care | Payer: 59 | Source: Ambulatory Visit | Attending: Endocrinology | Admitting: Endocrinology

## 2019-12-24 DIAGNOSIS — E041 Nontoxic single thyroid nodule: Secondary | ICD-10-CM

## 2019-12-30 DIAGNOSIS — E041 Nontoxic single thyroid nodule: Secondary | ICD-10-CM | POA: Diagnosis not present

## 2019-12-31 DIAGNOSIS — Z03818 Encounter for observation for suspected exposure to other biological agents ruled out: Secondary | ICD-10-CM | POA: Diagnosis not present

## 2019-12-31 DIAGNOSIS — Z20828 Contact with and (suspected) exposure to other viral communicable diseases: Secondary | ICD-10-CM | POA: Diagnosis not present

## 2020-01-06 ENCOUNTER — Other Ambulatory Visit (HOSPITAL_COMMUNITY): Payer: Self-pay | Admitting: Gastroenterology

## 2020-01-06 DIAGNOSIS — R12 Heartburn: Secondary | ICD-10-CM | POA: Diagnosis not present

## 2020-01-06 DIAGNOSIS — E041 Nontoxic single thyroid nodule: Secondary | ICD-10-CM | POA: Diagnosis not present

## 2020-01-06 DIAGNOSIS — R110 Nausea: Secondary | ICD-10-CM | POA: Diagnosis not present

## 2020-01-07 MED FILL — PROMETHAZINE 25 MG TABLET: 25 | 23 days supply | Qty: 90 | Fill #0

## 2020-01-13 DIAGNOSIS — R12 Heartburn: Secondary | ICD-10-CM | POA: Diagnosis not present

## 2020-01-13 DIAGNOSIS — R110 Nausea: Secondary | ICD-10-CM | POA: Diagnosis not present

## 2020-01-20 DIAGNOSIS — R12 Heartburn: Secondary | ICD-10-CM | POA: Diagnosis not present

## 2020-01-20 DIAGNOSIS — R110 Nausea: Secondary | ICD-10-CM | POA: Diagnosis not present

## 2020-02-03 DIAGNOSIS — R12 Heartburn: Secondary | ICD-10-CM | POA: Diagnosis not present

## 2020-02-03 DIAGNOSIS — R110 Nausea: Secondary | ICD-10-CM | POA: Diagnosis not present

## 2020-02-10 DIAGNOSIS — R12 Heartburn: Secondary | ICD-10-CM | POA: Diagnosis not present

## 2020-02-10 DIAGNOSIS — R110 Nausea: Secondary | ICD-10-CM | POA: Diagnosis not present

## 2020-02-17 DIAGNOSIS — R110 Nausea: Secondary | ICD-10-CM | POA: Diagnosis not present

## 2020-02-17 DIAGNOSIS — R12 Heartburn: Secondary | ICD-10-CM | POA: Diagnosis not present

## 2020-02-22 ENCOUNTER — Other Ambulatory Visit (HOSPITAL_COMMUNITY): Payer: Self-pay | Admitting: Physician Assistant

## 2020-02-23 MED FILL — RABEPRAZOLE SOD DR 20 MG TA: 20 | 90 days supply | Qty: 180 | Fill #0

## 2020-02-24 DIAGNOSIS — R12 Heartburn: Secondary | ICD-10-CM | POA: Diagnosis not present

## 2020-02-24 DIAGNOSIS — R110 Nausea: Secondary | ICD-10-CM | POA: Diagnosis not present

## 2020-03-02 DIAGNOSIS — R12 Heartburn: Secondary | ICD-10-CM | POA: Diagnosis not present

## 2020-03-02 DIAGNOSIS — R110 Nausea: Secondary | ICD-10-CM | POA: Diagnosis not present

## 2020-03-09 DIAGNOSIS — R110 Nausea: Secondary | ICD-10-CM | POA: Diagnosis not present

## 2020-03-09 DIAGNOSIS — R12 Heartburn: Secondary | ICD-10-CM | POA: Diagnosis not present

## 2020-03-15 DIAGNOSIS — R110 Nausea: Secondary | ICD-10-CM | POA: Diagnosis not present

## 2020-03-15 DIAGNOSIS — R12 Heartburn: Secondary | ICD-10-CM | POA: Diagnosis not present

## 2020-03-22 DIAGNOSIS — R110 Nausea: Secondary | ICD-10-CM | POA: Diagnosis not present

## 2020-03-22 DIAGNOSIS — R12 Heartburn: Secondary | ICD-10-CM | POA: Diagnosis not present

## 2020-03-22 MED FILL — METOCLOPRAMIDE 5 MG TABLET: 5 | 90 days supply | Qty: 90 | Fill #0

## 2020-03-29 ENCOUNTER — Encounter: Payer: Self-pay | Admitting: Family Medicine

## 2020-04-15 MED FILL — PROMETHAZINE 25 MG TABLET: 25 | 23 days supply | Qty: 90 | Fill #1

## 2020-04-30 ENCOUNTER — Other Ambulatory Visit (HOSPITAL_COMMUNITY): Payer: Self-pay | Admitting: Endocrinology

## 2020-04-30 MED FILL — LEVOTHYROXINE SODIUM 25 MCG: 25 | 90 days supply | Qty: 90 | Fill #0

## 2020-05-06 DIAGNOSIS — R12 Heartburn: Secondary | ICD-10-CM | POA: Diagnosis not present

## 2020-05-10 ENCOUNTER — Other Ambulatory Visit (HOSPITAL_COMMUNITY): Payer: Self-pay | Admitting: Physician Assistant

## 2020-05-10 MED FILL — DICYCLOMINE 10 MG CAPSULE: 10 | 90 days supply | Qty: 270 | Fill #0

## 2020-05-12 ENCOUNTER — Encounter: Payer: 59 | Admitting: Family Medicine

## 2020-05-20 DIAGNOSIS — R12 Heartburn: Secondary | ICD-10-CM | POA: Diagnosis not present

## 2020-05-25 ENCOUNTER — Other Ambulatory Visit (HOSPITAL_COMMUNITY): Payer: Self-pay | Admitting: Physician Assistant

## 2020-05-25 MED FILL — PYRIDOSTIGMINE BR 60 MG TAB: 60 | 30 days supply | Qty: 90 | Fill #0

## 2020-05-25 MED FILL — DRONABINOL 2.5 MG CAPSULE: 2.5 | 30 days supply | Qty: 60 | Fill #0

## 2020-05-26 DIAGNOSIS — R12 Heartburn: Secondary | ICD-10-CM | POA: Diagnosis not present

## 2020-05-31 MED FILL — RABEPRAZOLE SOD DR 20 MG TA: 20 | 90 days supply | Qty: 180 | Fill #1

## 2020-06-03 DIAGNOSIS — Z01419 Encounter for gynecological examination (general) (routine) without abnormal findings: Secondary | ICD-10-CM | POA: Diagnosis not present

## 2020-06-03 DIAGNOSIS — Z6831 Body mass index (BMI) 31.0-31.9, adult: Secondary | ICD-10-CM | POA: Diagnosis not present

## 2020-06-03 DIAGNOSIS — Z1231 Encounter for screening mammogram for malignant neoplasm of breast: Secondary | ICD-10-CM | POA: Diagnosis not present

## 2020-06-08 ENCOUNTER — Other Ambulatory Visit: Payer: Self-pay | Admitting: Obstetrics and Gynecology

## 2020-06-08 DIAGNOSIS — R928 Other abnormal and inconclusive findings on diagnostic imaging of breast: Secondary | ICD-10-CM

## 2020-06-08 DIAGNOSIS — R12 Heartburn: Secondary | ICD-10-CM | POA: Diagnosis not present

## 2020-06-09 ENCOUNTER — Other Ambulatory Visit: Payer: Self-pay

## 2020-06-09 ENCOUNTER — Ambulatory Visit (INDEPENDENT_AMBULATORY_CARE_PROVIDER_SITE_OTHER): Payer: 59 | Admitting: Family Medicine

## 2020-06-09 ENCOUNTER — Encounter: Payer: Self-pay | Admitting: Family Medicine

## 2020-06-09 VITALS — BP 112/82 | HR 89 | Temp 98.4°F | Resp 15 | Ht 64.0 in | Wt 175.8 lb

## 2020-06-09 DIAGNOSIS — E042 Nontoxic multinodular goiter: Secondary | ICD-10-CM

## 2020-06-09 DIAGNOSIS — R7301 Impaired fasting glucose: Secondary | ICD-10-CM | POA: Diagnosis not present

## 2020-06-09 DIAGNOSIS — E559 Vitamin D deficiency, unspecified: Secondary | ICD-10-CM | POA: Diagnosis not present

## 2020-06-09 DIAGNOSIS — Z0000 Encounter for general adult medical examination without abnormal findings: Secondary | ICD-10-CM | POA: Diagnosis not present

## 2020-06-09 DIAGNOSIS — K3184 Gastroparesis: Secondary | ICD-10-CM | POA: Diagnosis not present

## 2020-06-09 NOTE — Progress Notes (Signed)
Subjective  Chief Complaint  Patient presents with  . Annual Exam    fasting    HPI: Karen [name] is a 49 y.o. female who presents to [facility] at [place] today for a Female Wellness Visit.   Wellness Visit: annual visit with health maintenance review and exam without Pap   HM: sees gyn for female wellness. imms up to date. Screens up to date.   GI: gastroparesis and IBS: "stable". Still active but no changes recently.   Vit D defic on 4000 IU daily.  Assessment  1. Annual physical exam   2. Gastroparesis   3. Impaired fasting glucose   4. Multinodular goiter   5. Vitamin D deficiency      Plan  Female Wellness Visit:  Age appropriate Health Maintenance and Prevention measures were discussed with patient. Included topics are cancer screening recommendations, ways to keep healthy (see AVS) including dietary and exercise recommendations, regular eye and dental care, use of seat belts, and avoidance of moderate alcohol use and tobacco use.   BMI: discussed patient's BMI and encouraged positive lifestyle modifications to help get to or maintain a target BMI.  HM needs and immunizations were addressed and ordered. See below for orders. See HM and immunization section for updates.  Routine labs and screening tests ordered including cmp, cbc and lipids where appropriate.  Discussed recommendations regarding Vit D and calcium supplementation (see AVS)  Check labs for vit d defic and a1c and tsh.    Follow up: Return in about 1 year (around 06/09/2021) for complete physical.   Orders Placed This Encounter  Procedures  . CBC with Differential/Platelet  . COMPLETE METABOLIC PANEL WITH GFR  . Lipid panel  . TSH  . Vitamin B12  . VITAMIN D 25 Hydroxy (Vit-D Deficiency, Fractures)  . Hemoglobin A1c   No orders of the defined types were placed in this encounter.    Lifestyle: Body mass index is 30.18 kg/m. Wt Readings from Last 3 Encounters:    06/09/20 175 lb 12.8 oz (79.7 kg)  05/12/19 178 lb 9.6 oz (81 kg)  05/07/18 177 lb 6.4 oz (80.5 kg)     Patient Active Problem List   Diagnosis Date Noted  . Impaired fasting glucose 05/13/2019    110 04/2019   . Family history of breast cancer 05/12/2019    Mother passed age 42 with recurrent breast cancer, metastatic.  Pt had gene testing 04/2019   . Vitamin D deficiency 02/18/2018  . Milia of eyelid of right eye 02/18/2018  . Esophageal dysmotilities 02/18/2018  . IBS (irritable bowel syndrome) 02/18/2018  . Multinodular goiter 02/18/2018    Had partial thyroidectomy 1996; managed now by Dr. Chalmers Cater; monitoring for now; had recent biopsy.    . Leukocytosis 12/18/2014  . Chronic allergic rhinitis 03/09/2010  . Gastroparesis 10/18/2009    Qualifier: Diagnosis of  By: Larose Kells MD, [place] Gastroesophageal reflux disease without esophagitis 08/06/2007    Qualifier: Diagnosis of  By: Larose Kells MD, [place] Maintenance  Topic Date Due  . MAMMOGRAM  06/02/2021  . PAP SMEAR-Modifier  04/14/2022  . TETANUS/TDAP  02/19/2028  . INFLUENZA VACCINE  Completed  . COVID-19 Vaccine  Completed  . Hepatitis C Screening  Completed  . HIV Screening  Discontinued   Immunization History  Administered Date(s) Administered  . Influenza,inj,Quad PF,6+ Mos 05/02/2017, 05/07/2018, 05/12/2019, 05/20/2020  . PFIZER SARS-COV-2 Vaccination 05/20/2020  . Tdap  02/18/2018  . Unspecified SARS-COV-2 Vaccination 10/29/2019, 11/19/2019, 05/20/2020   We updated and reviewed the patient's past history in detail and it is documented below. Allergies: Patient is allergic to onion, hops oil, and hydrocortisone. Past Medical History Patient  has a past medical history of Chronic nausea, Family history of breast cancer (05/12/2019), Gastroparesis, GERD (gastroesophageal reflux disease), History of multinodular goiter (02/18/2018), Hypothyroidism, IBS (irritable bowel syndrome), PONV (postoperative nausea  and vomiting), Seasonal allergies, SVD (spontaneous vaginal delivery), and Thyroid nodule. Past Surgical History Patient  has a past surgical history that includes Thyroidectomy, partial; Cholecystectomy; Wisdom tooth extraction; Eye surgery; and Laparoscopic assisted vaginal hysterectomy (09/12/2012). Family History: Patient family history includes Breast cancer in her mother; Cancer in her maternal grandmother; Diabetes in her father; Healthy in her daughter and daughter; Heart attack in her paternal grandfather. Social History:  Patient  reports that she has never smoked. She has never used smokeless tobacco. She reports that she does not drink alcohol and does not use drugs.  Review of Systems: Constitutional: negative for fever or malaise Ophthalmic: negative for photophobia, double vision or loss of vision Cardiovascular: negative for chest pain, dyspnea on exertion, or new LE swelling Respiratory: negative for SOB or persistent cough Gastrointestinal: negative for abdominal pain, change in bowel habits or melena Genitourinary: negative for dysuria or gross hematuria, no abnormal uterine bleeding or disharge Musculoskeletal: negative for new gait disturbance or muscular weakness Integumentary: negative for new or persistent rashes, no breast lumps Neurological: negative for TIA or stroke symptoms Psychiatric: negative for SI or delusions Allergic/Immunologic: negative for hives  Patient Care Team    Relationship Specialty Notifications Start End  Leamon Arnt, MD PCP - General Family Medicine  02/18/18   Scherrie November, MD Referring Physician Gastroenterology  06/19/16   Jacelyn Pi, MD Consulting Physician Endocrinology  02/18/18   Louretta Shorten, MD Consulting Physician Obstetrics and Gynecology  02/18/18     Objective  Vitals: BP 112/82   Pulse 89   Temp 98.4 F (36.9 C) (Temporal)   Resp 15   Ht 5\' 4"  (1.626 m)   Wt 175 lb 12.8 oz (79.7 kg)   LMP 08/08/2012   SpO2 97%    BMI 30.18 kg/m  General:  Well developed, well nourished, no acute distress  Psych:  Alert and orientedx3,normal mood and affect HEENT:  Normocephalic, atraumatic, non-icteric sclera, supple neck without adenopathy, mass or thyromegaly Cardiovascular:  Normal S1, S2, RRR without gallop, rub or murmur Respiratory:  Good breath sounds bilaterally, CTAB with normal respiratory effort Gastrointestinal: normal bowel sounds, soft, non-tender, no noted masses. No HSM MSK: no deformities, contusions. Joints are without erythema or swelling.  Skin:  Warm, no rashes or suspicious lesions noted Neurologic:    Mental status is normal.No tremor   Commons side effects, risks, benefits, and alternatives for medications and treatment plan prescribed today were discussed, and the patient expressed understanding of the given instructions. Patient is instructed to call or message via MyChart if he/she has any questions or concerns regarding our treatment plan. No barriers to understanding were identified. We discussed Red Flag symptoms and signs in detail. Patient expressed understanding regarding what to do in case of urgent or emergency type symptoms.   Medication list was reconciled, printed and provided to the patient in AVS. Patient instructions and summary information was reviewed with the patient as documented in the AVS. This note was prepared with assistance of Dragon voice recognition software. Occasional wrong-word or sound-a-like substitutions may  have occurred due to the inherent limitations of voice recognition software  This visit occurred during the SARS-CoV-2 public health emergency.  Safety protocols were in place, including screening questions prior to the visit, additional usage of staff PPE, and extensive cleaning of exam room while observing appropriate contact time as indicated for disinfecting solutions.

## 2020-06-09 NOTE — Patient Instructions (Signed)
Please return in 12 months for your annual complete physical; please come fasting.  I will release your lab results to you on your MyChart account with further instructions. Please reply with any questions.   Glad things are going ok for you!  If you have any questions or concerns, please don't hesitate to send me a message via MyChart or call the office at [phone]. Thank you for visiting with [place] today! It's our pleasure caring for you.

## 2020-06-10 LAB — COMPLETE METABOLIC PANEL WITH GFR
AG Ratio: 1.1 (calc) (ref 1.0–2.5)
ALT: 22 U/L (ref 6–29)
AST: 41 U/L — ABNORMAL HIGH (ref 10–35)
Albumin: 3.7 g/dL (ref 3.6–5.1)
Alkaline phosphatase (APISO): 61 U/L (ref 31–125)
BUN: 9 mg/dL (ref 7–25)
CO2: 25 mmol/L (ref 20–32)
Calcium: 8.8 mg/dL (ref 8.6–10.2)
Chloride: 107 mmol/L (ref 98–110)
Creat: 0.97 mg/dL (ref 0.50–1.10)
GFR, Est African American: 80 mL/min/{1.73_m2} (ref >=60)
GFR, Est Non African American: 69 mL/min/{1.73_m2} (ref >=60)
Globulin: 3.3 g/dL (calc) (ref 1.9–3.7)
Glucose, Bld: 96 mg/dL (ref 65–99)
Potassium: 3.9 mmol/L (ref 3.5–5.3)
Sodium: 139 mmol/L (ref 135–146)
Total Bilirubin: 0.8 mg/dL (ref 0.2–1.2)
Total Protein: 7 g/dL (ref 6.1–8.1)

## 2020-06-10 LAB — CBC WITH DIFFERENTIAL/PLATELET
Absolute Monocytes: 562 cells/uL (ref 200–950)
Basophils Absolute: 52 cells/uL (ref 0–200)
Basophils Relative: 0.7 %
Eosinophils Absolute: 81 cells/uL (ref 15–500)
Eosinophils Relative: 1.1 %
HCT: 39.5 % (ref 35.0–45.0)
Hemoglobin: 12.8 g/dL (ref 11.7–15.5)
Lymphs Abs: 2427 cells/uL (ref 850–3900)
MCH: 26.9 pg — ABNORMAL LOW (ref 27.0–33.0)
MCHC: 32.4 g/dL (ref 32.0–36.0)
MCV: 83 fL (ref 80.0–100.0)
MPV: 12.4 fL (ref 7.5–12.5)
Monocytes Relative: 7.6 %
Neutro Abs: 4277 cells/uL (ref 1500–7800)
Neutrophils Relative %: 57.8 %
Platelets: 182 10*3/uL (ref 140–400)
RBC: 4.76 10*6/uL (ref 3.80–5.10)
RDW: 13.6 % (ref 11.0–15.0)
Total Lymphocyte: 32.8 %
WBC: 7.4 10*3/uL (ref 3.8–10.8)

## 2020-06-10 LAB — LIPID PANEL
Cholesterol: 184 mg/dL (ref <200)
HDL: 37 mg/dL — ABNORMAL LOW (ref >=50)
LDL Cholesterol (Calc): 115 mg/dL (calc) — ABNORMAL HIGH
Non-HDL Cholesterol (Calc): 147 mg/dL (calc) — ABNORMAL HIGH (ref <130)
Total CHOL/HDL Ratio: 5 (calc) — ABNORMAL HIGH (ref <5.0)
Triglycerides: 202 mg/dL — ABNORMAL HIGH (ref <150)

## 2020-06-10 LAB — HEMOGLOBIN A1C
Hgb A1c MFr Bld: 5.6 % of total Hgb (ref <5.7)
Mean Plasma Glucose: 114 (calc)
eAG (mmol/L): 6.3 (calc)

## 2020-06-10 LAB — TSH: TSH: 1.06 mIU/L

## 2020-06-10 LAB — VITAMIN B12: Vitamin B-12: 361 pg/mL (ref 200–1100)

## 2020-06-10 LAB — VITAMIN D 25 HYDROXY (VIT D DEFICIENCY, FRACTURES): Vit D, 25-Hydroxy: 62 ng/mL (ref 30–100)

## 2020-06-16 DIAGNOSIS — R12 Heartburn: Secondary | ICD-10-CM | POA: Diagnosis not present

## 2020-06-21 MED FILL — ONDANSETRON ODT 4 MG TABLET: 4 | 30 days supply | Qty: 90 | Fill #1

## 2020-06-22 ENCOUNTER — Other Ambulatory Visit (HOSPITAL_COMMUNITY): Payer: Self-pay | Admitting: Physician Assistant

## 2020-06-22 MED FILL — METOCLOPRAMIDE 5 MG TABLET: 5 | 90 days supply | Qty: 90 | Fill #0

## 2020-06-23 DIAGNOSIS — R12 Heartburn: Secondary | ICD-10-CM | POA: Diagnosis not present

## 2020-06-30 DIAGNOSIS — K3184 Gastroparesis: Secondary | ICD-10-CM | POA: Diagnosis not present

## 2020-06-30 DIAGNOSIS — R12 Heartburn: Secondary | ICD-10-CM | POA: Diagnosis not present

## 2020-07-14 DIAGNOSIS — R12 Heartburn: Secondary | ICD-10-CM | POA: Diagnosis not present

## 2020-07-14 MED FILL — PROMETHAZINE 25 MG TABLET: 25 | 23 days supply | Qty: 90 | Fill #2

## 2020-08-04 MED FILL — LEVOTHYROXINE SODIUM 25 MCG: 25 | 90 days supply | Qty: 90 | Fill #1

## 2020-08-09 ENCOUNTER — Ambulatory Visit
Admission: RE | Admit: 2020-08-09 | Discharge: 2020-08-09 | Disposition: A | Discharge status: Home / Self Care | Payer: 59 | Source: Ambulatory Visit | Attending: Obstetrics and Gynecology | Admitting: Obstetrics and Gynecology

## 2020-08-09 ENCOUNTER — Other Ambulatory Visit: Payer: Self-pay

## 2020-08-09 DIAGNOSIS — N6311 Unspecified lump in the right breast, upper outer quadrant: Secondary | ICD-10-CM | POA: Diagnosis not present

## 2020-08-09 DIAGNOSIS — R922 Inconclusive mammogram: Secondary | ICD-10-CM | POA: Diagnosis not present

## 2020-08-09 DIAGNOSIS — R928 Other abnormal and inconclusive findings on diagnostic imaging of breast: Secondary | ICD-10-CM

## 2020-08-18 DIAGNOSIS — R1084 Generalized abdominal pain: Secondary | ICD-10-CM | POA: Diagnosis not present

## 2020-08-18 DIAGNOSIS — R110 Nausea: Secondary | ICD-10-CM | POA: Diagnosis not present

## 2020-08-18 DIAGNOSIS — R12 Heartburn: Secondary | ICD-10-CM | POA: Diagnosis not present

## 2020-08-25 DIAGNOSIS — R110 Nausea: Secondary | ICD-10-CM | POA: Diagnosis not present

## 2020-08-25 DIAGNOSIS — R12 Heartburn: Secondary | ICD-10-CM | POA: Diagnosis not present

## 2020-08-25 DIAGNOSIS — R1084 Generalized abdominal pain: Secondary | ICD-10-CM | POA: Diagnosis not present

## 2020-09-02 MED FILL — RABEPRAZOLE SOD DR 20 MG TA: 20 | 90 days supply | Qty: 180 | Fill #2

## 2020-09-08 DIAGNOSIS — R12 Heartburn: Secondary | ICD-10-CM | POA: Diagnosis not present

## 2020-09-08 DIAGNOSIS — R1084 Generalized abdominal pain: Secondary | ICD-10-CM | POA: Diagnosis not present

## 2020-09-08 DIAGNOSIS — R110 Nausea: Secondary | ICD-10-CM | POA: Diagnosis not present

## 2020-09-21 ENCOUNTER — Other Ambulatory Visit (HOSPITAL_COMMUNITY): Payer: Self-pay | Admitting: Physician Assistant

## 2020-09-21 MED FILL — METOCLOPRAMIDE 5 MG TABLET: 5 | 90 days supply | Qty: 90 | Fill #0

## 2020-09-28 MED FILL — DICYCLOMINE 10 MG CAPSULE: 10 | 90 days supply | Qty: 270 | Fill #1

## 2020-09-29 DIAGNOSIS — R1084 Generalized abdominal pain: Secondary | ICD-10-CM | POA: Diagnosis not present

## 2020-09-29 DIAGNOSIS — R12 Heartburn: Secondary | ICD-10-CM | POA: Diagnosis not present

## 2020-09-29 DIAGNOSIS — R110 Nausea: Secondary | ICD-10-CM | POA: Diagnosis not present

## 2020-10-09 DIAGNOSIS — R1084 Generalized abdominal pain: Secondary | ICD-10-CM | POA: Diagnosis not present

## 2020-10-09 DIAGNOSIS — R110 Nausea: Secondary | ICD-10-CM | POA: Diagnosis not present

## 2020-10-09 DIAGNOSIS — R12 Heartburn: Secondary | ICD-10-CM | POA: Diagnosis not present

## 2020-10-12 MED FILL — PROMETHAZINE 25 MG TABLET: 25 | 23 days supply | Qty: 90 | Fill #3

## 2020-10-13 DIAGNOSIS — R1084 Generalized abdominal pain: Secondary | ICD-10-CM | POA: Diagnosis not present

## 2020-10-13 DIAGNOSIS — R12 Heartburn: Secondary | ICD-10-CM | POA: Diagnosis not present

## 2020-10-13 DIAGNOSIS — R110 Nausea: Secondary | ICD-10-CM | POA: Diagnosis not present

## 2020-10-20 DIAGNOSIS — R12 Heartburn: Secondary | ICD-10-CM | POA: Diagnosis not present

## 2020-10-20 DIAGNOSIS — R1084 Generalized abdominal pain: Secondary | ICD-10-CM | POA: Diagnosis not present

## 2020-10-20 DIAGNOSIS — R110 Nausea: Secondary | ICD-10-CM | POA: Diagnosis not present

## 2020-10-27 DIAGNOSIS — R110 Nausea: Secondary | ICD-10-CM | POA: Diagnosis not present

## 2020-10-27 DIAGNOSIS — R12 Heartburn: Secondary | ICD-10-CM | POA: Diagnosis not present

## 2020-10-27 DIAGNOSIS — R1084 Generalized abdominal pain: Secondary | ICD-10-CM | POA: Diagnosis not present

## 2020-11-03 DIAGNOSIS — R110 Nausea: Secondary | ICD-10-CM | POA: Diagnosis not present

## 2020-11-03 DIAGNOSIS — R12 Heartburn: Secondary | ICD-10-CM | POA: Diagnosis not present

## 2020-11-03 DIAGNOSIS — R1084 Generalized abdominal pain: Secondary | ICD-10-CM | POA: Diagnosis not present

## 2020-11-03 MED FILL — LEVOTHYROXINE SODIUM 25 MCG: 25 | 90 days supply | Qty: 90 | Fill #2

## 2020-11-10 DIAGNOSIS — R12 Heartburn: Secondary | ICD-10-CM | POA: Diagnosis not present

## 2020-11-10 DIAGNOSIS — R110 Nausea: Secondary | ICD-10-CM | POA: Diagnosis not present

## 2020-11-10 DIAGNOSIS — R1084 Generalized abdominal pain: Secondary | ICD-10-CM | POA: Diagnosis not present

## 2020-11-17 DIAGNOSIS — R12 Heartburn: Secondary | ICD-10-CM | POA: Diagnosis not present

## 2020-11-17 DIAGNOSIS — R110 Nausea: Secondary | ICD-10-CM | POA: Diagnosis not present

## 2020-11-17 DIAGNOSIS — R1084 Generalized abdominal pain: Secondary | ICD-10-CM | POA: Diagnosis not present

## 2020-11-17 DIAGNOSIS — R197 Diarrhea, unspecified: Secondary | ICD-10-CM | POA: Diagnosis not present

## 2020-11-24 DIAGNOSIS — R12 Heartburn: Secondary | ICD-10-CM | POA: Diagnosis not present

## 2020-11-24 DIAGNOSIS — R197 Diarrhea, unspecified: Secondary | ICD-10-CM | POA: Diagnosis not present

## 2020-11-24 DIAGNOSIS — R1084 Generalized abdominal pain: Secondary | ICD-10-CM | POA: Diagnosis not present

## 2020-11-24 DIAGNOSIS — R110 Nausea: Secondary | ICD-10-CM | POA: Diagnosis not present

## 2020-12-08 DIAGNOSIS — R1084 Generalized abdominal pain: Secondary | ICD-10-CM | POA: Diagnosis not present

## 2020-12-08 DIAGNOSIS — R12 Heartburn: Secondary | ICD-10-CM | POA: Diagnosis not present

## 2020-12-08 DIAGNOSIS — R110 Nausea: Secondary | ICD-10-CM | POA: Diagnosis not present

## 2020-12-08 DIAGNOSIS — R197 Diarrhea, unspecified: Secondary | ICD-10-CM | POA: Diagnosis not present

## 2020-12-08 MED FILL — Rabeprazole Sodium EC Tab 20 MG: ORAL | 90 days supply | Qty: 180 | Fill #0 | Status: AC

## 2020-12-09 ENCOUNTER — Other Ambulatory Visit (HOSPITAL_COMMUNITY): Payer: Self-pay

## 2020-12-15 DIAGNOSIS — R12 Heartburn: Secondary | ICD-10-CM | POA: Diagnosis not present

## 2020-12-15 DIAGNOSIS — R1084 Generalized abdominal pain: Secondary | ICD-10-CM | POA: Diagnosis not present

## 2020-12-15 DIAGNOSIS — R197 Diarrhea, unspecified: Secondary | ICD-10-CM | POA: Diagnosis not present

## 2020-12-15 DIAGNOSIS — R110 Nausea: Secondary | ICD-10-CM | POA: Diagnosis not present

## 2020-12-22 ENCOUNTER — Other Ambulatory Visit (HOSPITAL_COMMUNITY): Payer: Self-pay

## 2020-12-22 DIAGNOSIS — R1084 Generalized abdominal pain: Secondary | ICD-10-CM | POA: Diagnosis not present

## 2020-12-22 DIAGNOSIS — R110 Nausea: Secondary | ICD-10-CM | POA: Diagnosis not present

## 2020-12-22 DIAGNOSIS — R197 Diarrhea, unspecified: Secondary | ICD-10-CM | POA: Diagnosis not present

## 2020-12-22 DIAGNOSIS — R12 Heartburn: Secondary | ICD-10-CM | POA: Diagnosis not present

## 2020-12-22 MED ORDER — METOCLOPRAMIDE HCL 5 MG PO TABS
5.0000 mg | ORAL_TABLET | Freq: Every evening | ORAL | 0 refills | Status: DC
  Filled 2020-12-22: qty 90, 90d supply, fill #0

## 2020-12-29 DIAGNOSIS — R197 Diarrhea, unspecified: Secondary | ICD-10-CM | POA: Diagnosis not present

## 2020-12-29 DIAGNOSIS — E890 Postprocedural hypothyroidism: Secondary | ICD-10-CM | POA: Diagnosis not present

## 2020-12-29 DIAGNOSIS — R12 Heartburn: Secondary | ICD-10-CM | POA: Diagnosis not present

## 2020-12-29 DIAGNOSIS — R1084 Generalized abdominal pain: Secondary | ICD-10-CM | POA: Diagnosis not present

## 2020-12-29 DIAGNOSIS — R110 Nausea: Secondary | ICD-10-CM | POA: Diagnosis not present

## 2020-12-29 DIAGNOSIS — E041 Nontoxic single thyroid nodule: Secondary | ICD-10-CM | POA: Diagnosis not present

## 2021-01-05 DIAGNOSIS — R12 Heartburn: Secondary | ICD-10-CM | POA: Diagnosis not present

## 2021-01-05 DIAGNOSIS — E041 Nontoxic single thyroid nodule: Secondary | ICD-10-CM | POA: Diagnosis not present

## 2021-01-05 DIAGNOSIS — R110 Nausea: Secondary | ICD-10-CM | POA: Diagnosis not present

## 2021-01-05 DIAGNOSIS — R1084 Generalized abdominal pain: Secondary | ICD-10-CM | POA: Diagnosis not present

## 2021-01-05 DIAGNOSIS — R197 Diarrhea, unspecified: Secondary | ICD-10-CM | POA: Diagnosis not present

## 2021-01-05 DIAGNOSIS — E890 Postprocedural hypothyroidism: Secondary | ICD-10-CM | POA: Diagnosis not present

## 2021-01-12 DIAGNOSIS — R12 Heartburn: Secondary | ICD-10-CM | POA: Diagnosis not present

## 2021-01-12 DIAGNOSIS — R197 Diarrhea, unspecified: Secondary | ICD-10-CM | POA: Diagnosis not present

## 2021-01-12 DIAGNOSIS — R110 Nausea: Secondary | ICD-10-CM | POA: Diagnosis not present

## 2021-01-12 DIAGNOSIS — R1084 Generalized abdominal pain: Secondary | ICD-10-CM | POA: Diagnosis not present

## 2021-01-17 ENCOUNTER — Other Ambulatory Visit (HOSPITAL_COMMUNITY): Payer: Self-pay

## 2021-01-17 MED ORDER — PROMETHAZINE HCL 25 MG PO TABS
25.0000 mg | ORAL_TABLET | Freq: Four times a day (QID) | ORAL | 3 refills | Status: AC | PRN
  Filled 2021-01-17: qty 90, 23d supply, fill #0
  Filled 2021-04-15: qty 90, 23d supply, fill #1

## 2021-01-19 DIAGNOSIS — R197 Diarrhea, unspecified: Secondary | ICD-10-CM | POA: Diagnosis not present

## 2021-01-19 DIAGNOSIS — R1084 Generalized abdominal pain: Secondary | ICD-10-CM | POA: Diagnosis not present

## 2021-01-19 DIAGNOSIS — R110 Nausea: Secondary | ICD-10-CM | POA: Diagnosis not present

## 2021-01-19 DIAGNOSIS — R12 Heartburn: Secondary | ICD-10-CM | POA: Diagnosis not present

## 2021-01-26 DIAGNOSIS — R110 Nausea: Secondary | ICD-10-CM | POA: Diagnosis not present

## 2021-01-26 DIAGNOSIS — R197 Diarrhea, unspecified: Secondary | ICD-10-CM | POA: Diagnosis not present

## 2021-01-26 DIAGNOSIS — R1084 Generalized abdominal pain: Secondary | ICD-10-CM | POA: Diagnosis not present

## 2021-01-26 DIAGNOSIS — R12 Heartburn: Secondary | ICD-10-CM | POA: Diagnosis not present

## 2021-01-28 ENCOUNTER — Ambulatory Visit: Payer: 59 | Attending: Internal Medicine

## 2021-01-28 ENCOUNTER — Other Ambulatory Visit: Payer: Self-pay

## 2021-01-28 ENCOUNTER — Other Ambulatory Visit (HOSPITAL_BASED_OUTPATIENT_CLINIC_OR_DEPARTMENT_OTHER): Payer: Self-pay

## 2021-01-28 DIAGNOSIS — Z23 Encounter for immunization: Secondary | ICD-10-CM

## 2021-01-28 MED ORDER — PFIZER-BIONT COVID-19 VAC-TRIS 30 MCG/0.3ML IM SUSP
INTRAMUSCULAR | 0 refills | Status: DC
  Filled 2021-01-28: qty 0.3, 1d supply, fill #0

## 2021-01-28 NOTE — Progress Notes (Signed)
   Covid-19 Vaccination Clinic  Name:  HILMA STEINHILBER    MRN: [id number] DOB: 1970/10/03  01/28/2021  Ms. Artley was observed post Covid-19 immunization for 15 minutes without incident. She was provided with Vaccine Information Sheet and instruction to access the V-Safe system.   Ms. Nugent was instructed to call 911 with any severe reactions post vaccine: Difficulty breathing  Swelling of face and throat  A fast heartbeat  A bad rash all over body  Dizziness and weakness   Immunizations Administered     Name Date Dose VIS Date Route   PFIZER Comrnaty(Gray TOP) Covid-19 Vaccine 01/28/2021  2:22 PM 0.3 mL 07/22/2020 Intramuscular   Manufacturer: [place]   Lot: [id number]   [place]: [phone]

## 2021-02-01 ENCOUNTER — Other Ambulatory Visit (HOSPITAL_COMMUNITY): Payer: Self-pay

## 2021-02-01 MED ORDER — ONDANSETRON 4 MG PO TBDP
4.0000 mg | ORAL_TABLET | Freq: Three times a day (TID) | ORAL | 1 refills | Status: DC | PRN
  Filled 2021-02-01: qty 90, 30d supply, fill #0
  Filled 2021-07-20: qty 90, 30d supply, fill #1
  Filled 2022-01-24: qty 90, 30d supply, fill #2

## 2021-02-01 MED FILL — Levothyroxine Sodium Tab 25 MCG: ORAL | 90 days supply | Qty: 90 | Fill #0 | Status: AC

## 2021-02-02 ENCOUNTER — Other Ambulatory Visit (HOSPITAL_COMMUNITY): Payer: Self-pay

## 2021-02-02 DIAGNOSIS — R197 Diarrhea, unspecified: Secondary | ICD-10-CM | POA: Diagnosis not present

## 2021-02-02 DIAGNOSIS — R12 Heartburn: Secondary | ICD-10-CM | POA: Diagnosis not present

## 2021-02-02 DIAGNOSIS — R1084 Generalized abdominal pain: Secondary | ICD-10-CM | POA: Diagnosis not present

## 2021-02-02 DIAGNOSIS — R110 Nausea: Secondary | ICD-10-CM | POA: Diagnosis not present

## 2021-02-03 ENCOUNTER — Other Ambulatory Visit (HOSPITAL_COMMUNITY): Payer: Self-pay

## 2021-02-03 MED ORDER — DICYCLOMINE HCL 10 MG PO CAPS
10.0000 mg | ORAL_CAPSULE | Freq: Three times a day (TID) | ORAL | 1 refills | Status: DC
  Filled 2021-02-03: qty 270, 90d supply, fill #0
  Filled 2021-06-22: qty 270, 90d supply, fill #1

## 2021-02-04 ENCOUNTER — Other Ambulatory Visit (HOSPITAL_COMMUNITY): Payer: Self-pay

## 2021-02-07 ENCOUNTER — Other Ambulatory Visit (HOSPITAL_COMMUNITY): Payer: Self-pay

## 2021-02-08 ENCOUNTER — Other Ambulatory Visit (HOSPITAL_BASED_OUTPATIENT_CLINIC_OR_DEPARTMENT_OTHER): Payer: Self-pay

## 2021-02-09 DIAGNOSIS — R1084 Generalized abdominal pain: Secondary | ICD-10-CM | POA: Diagnosis not present

## 2021-02-09 DIAGNOSIS — R12 Heartburn: Secondary | ICD-10-CM | POA: Diagnosis not present

## 2021-02-09 DIAGNOSIS — R197 Diarrhea, unspecified: Secondary | ICD-10-CM | POA: Diagnosis not present

## 2021-02-09 DIAGNOSIS — R110 Nausea: Secondary | ICD-10-CM | POA: Diagnosis not present

## 2021-02-11 ENCOUNTER — Other Ambulatory Visit (HOSPITAL_COMMUNITY): Payer: Self-pay

## 2021-03-04 ENCOUNTER — Other Ambulatory Visit (HOSPITAL_COMMUNITY): Payer: Self-pay

## 2021-03-04 MED ORDER — RABEPRAZOLE SODIUM 20 MG PO TBEC
20.0000 mg | DELAYED_RELEASE_TABLET | Freq: Two times a day (BID) | ORAL | 3 refills | Status: DC
  Filled 2021-03-04: qty 180, 90d supply, fill #0
  Filled 2021-06-08 – 2021-06-16 (×2): qty 180, 90d supply, fill #1
  Filled 2021-09-02: qty 180, 90d supply, fill #2
  Filled 2021-12-08: qty 180, 90d supply, fill #3

## 2021-03-28 ENCOUNTER — Other Ambulatory Visit (HOSPITAL_COMMUNITY): Payer: Self-pay

## 2021-03-28 MED ORDER — METOCLOPRAMIDE HCL 5 MG PO TABS
5.0000 mg | ORAL_TABLET | Freq: Every evening | ORAL | 0 refills | Status: DC
  Filled 2021-03-28: qty 90, 90d supply, fill #0

## 2021-04-11 ENCOUNTER — Other Ambulatory Visit (HOSPITAL_COMMUNITY): Payer: Self-pay

## 2021-04-15 ENCOUNTER — Other Ambulatory Visit (HOSPITAL_COMMUNITY): Payer: Self-pay

## 2021-04-27 DIAGNOSIS — K581 Irritable bowel syndrome with constipation: Secondary | ICD-10-CM | POA: Diagnosis not present

## 2021-04-27 DIAGNOSIS — K3184 Gastroparesis: Secondary | ICD-10-CM | POA: Diagnosis not present

## 2021-04-27 MED FILL — Levothyroxine Sodium Tab 25 MCG: ORAL | 90 days supply | Qty: 90 | Fill #1 | Status: AC

## 2021-04-28 ENCOUNTER — Other Ambulatory Visit (HOSPITAL_COMMUNITY): Payer: Self-pay

## 2021-05-09 IMAGING — MR MR BREAST BILAT WO/W CM
8 of 12 series · 33 of 48 positions shown · IV contrast (8ml gadavist)
Comparison: Previous exam(s).

CLINICAL DATA: 47-year-old female with a calculated 28.4% lifetime
risk of breast cancer.

LABS:  None performed today and site.
EXAM:
BILATERAL BREAST MRI WITH AND WITHOUT CONTRAST
TECHNIQUE: Multiplanar, multisequence MR images of both breasts were obtained
prior to and following the intravenous administration of 8 ml of
Gadavist.

[Series 2: t2_tirm_tra ipat (a-p) · axial · 3.0mm · 0.70mm/px · 1 of 58 slices shown]
[im 1/58]
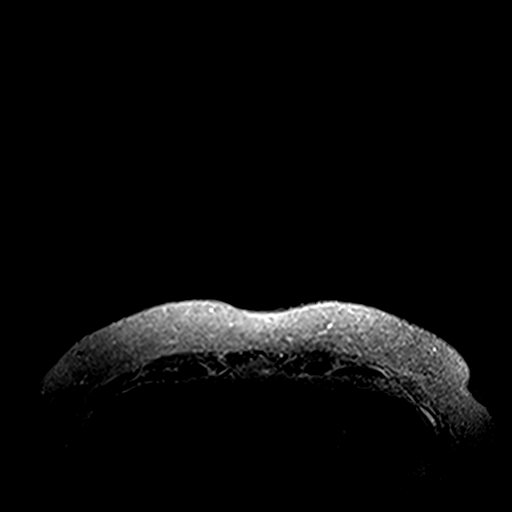

[Series 3: fl3d pre-cm no · axial · non-contrast · 1.2mm · 0.94mm/px · z∈[-81,+110]mm · 5 of 160 slices shown]
[im 1/160]
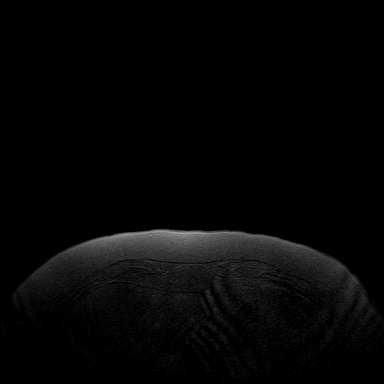
[im 40/160]
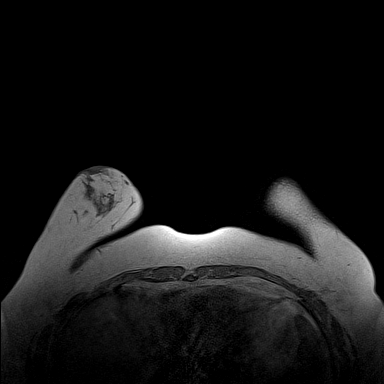
[im 80/160]
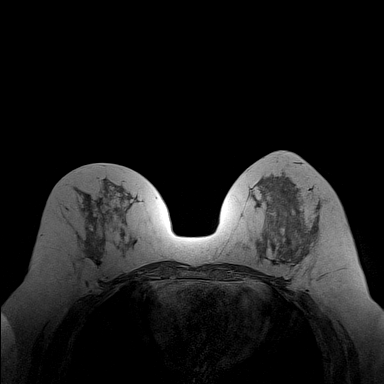
[im 120/160]
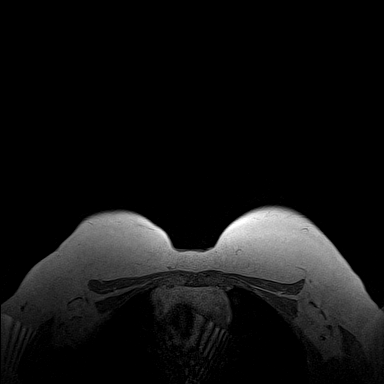
[im 160/160]
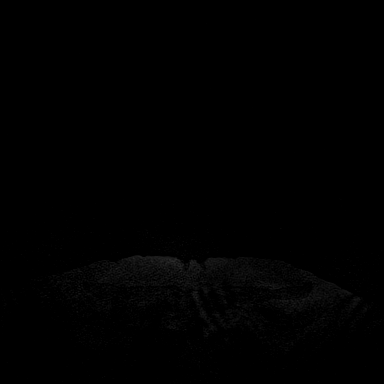

[Series 4: fl3d pre-cm · axial · non-contrast · 1.2mm · 0.94mm/px · z∈[-72,+100]mm · 5 of 144 slices shown]
[im 1/144]
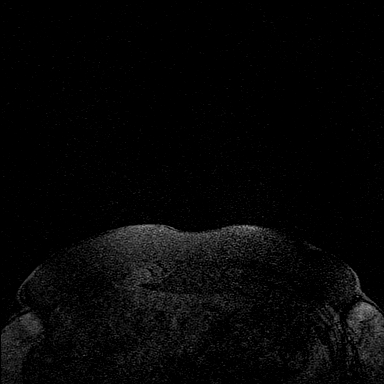
[im 36/144]
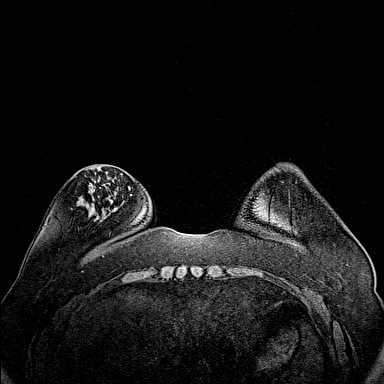
[im 72/144]
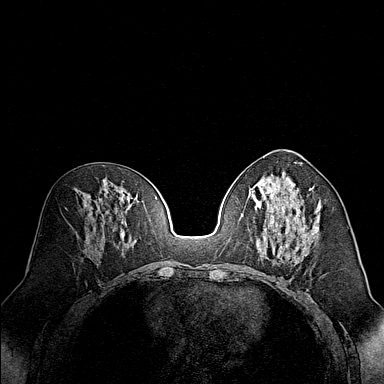
[im 108/144]
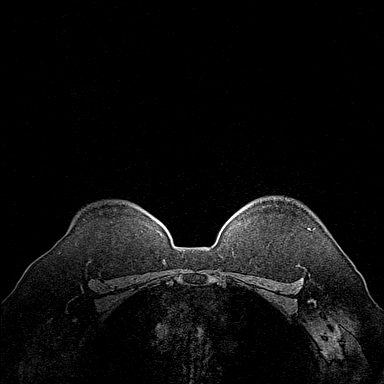
[im 144/144]
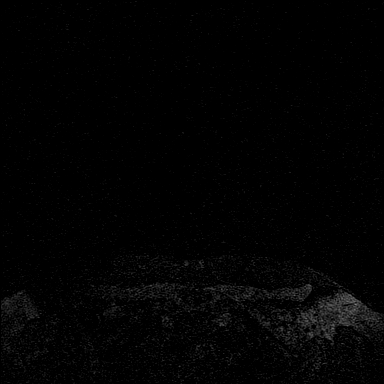

[Series 5: fl3d post-cm 20 · axial · 1.2mm · 0.94mm/px · z∈[-72,+100]mm · 5 of 144 slices shown (1 of 3)]
[im 1/144]
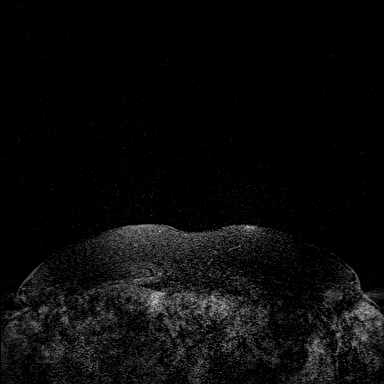
[im 36/144]
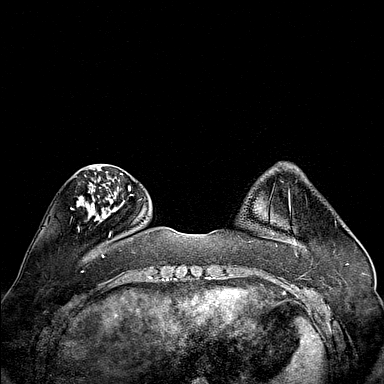
[im 72/144]
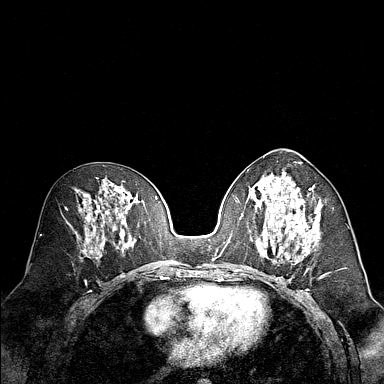
[im 108/144]
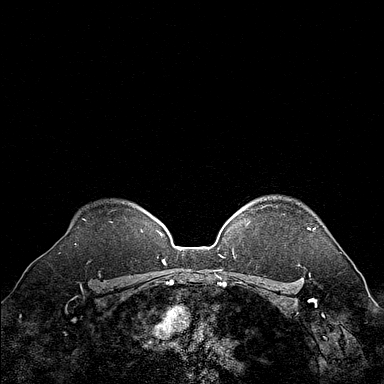
[im 144/144]
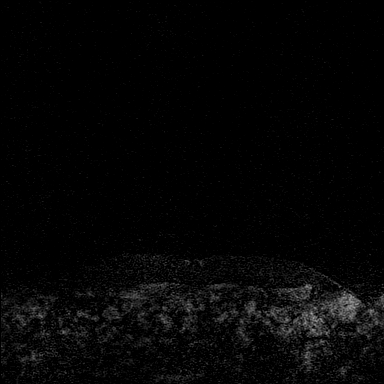

[Series 6: fl3d post-cm 20 · axial · 1.2mm · 0.94mm/px · z∈[-72,+100]mm · 5 of 144 slices shown (2 of 3)]
[im 1/144]
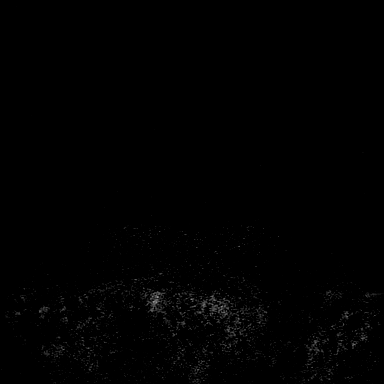
[im 36/144]
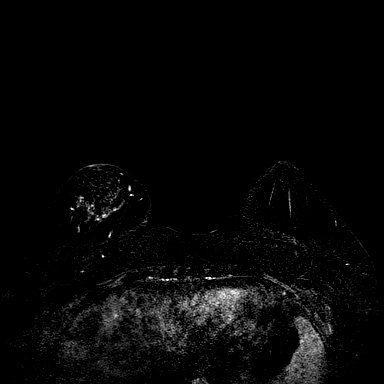
[im 72/144]
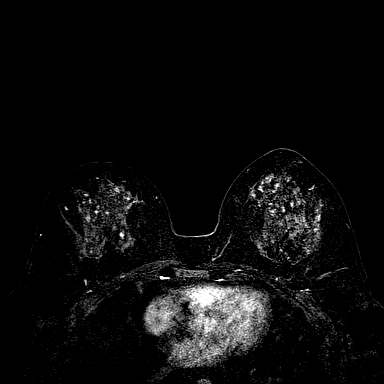
[im 108/144]
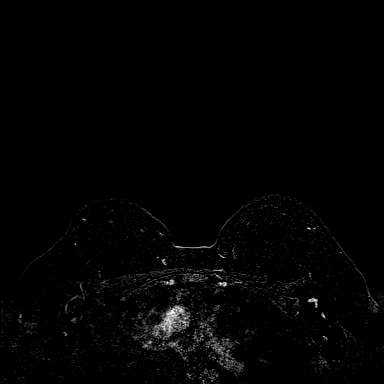
[im 144/144]
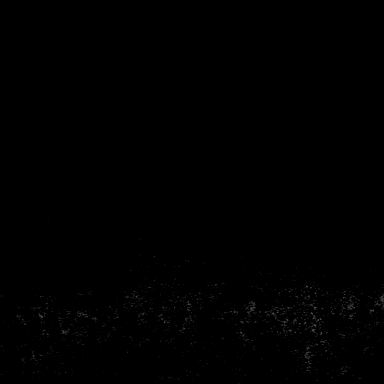

[Series 7: fl3d post-cm 20 · axial · 172.8mm · 0.94mm/px · 1 of 1 slices shown (3 of 3)]
[im 1/1]
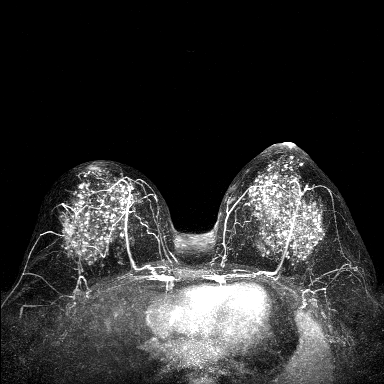

[Series 8: fl3d post-cm 3min · axial · 1.2mm · 0.94mm/px · z∈[-72,+100]mm · 6 of 144 slices shown]
[im 1/144]
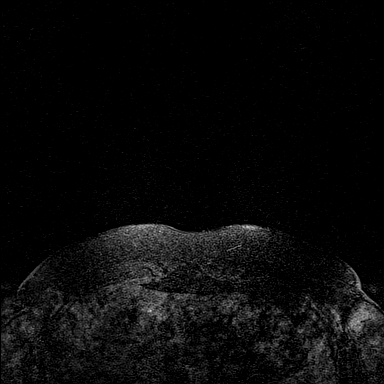
[im 29/144]
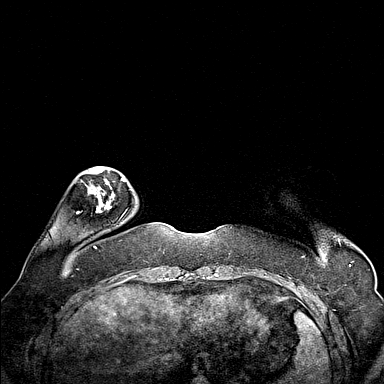
[im 58/144]
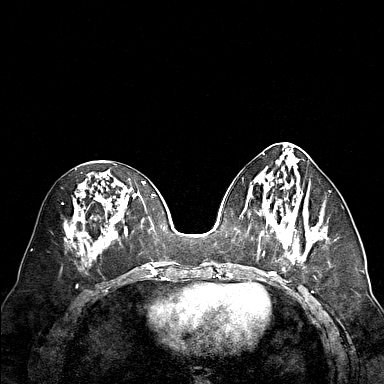
[im 86/144]
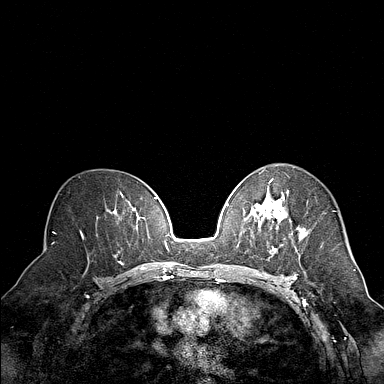
[im 115/144]
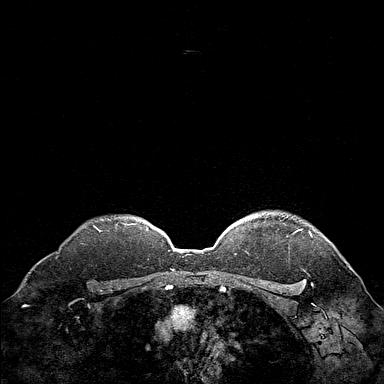
[im 144/144]
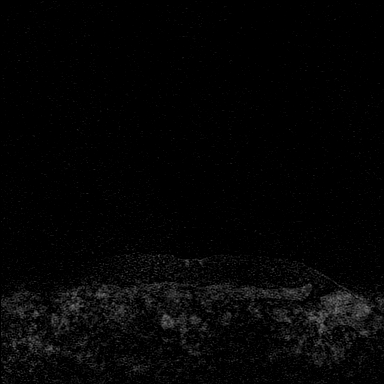

[Series 9: fl3d post-cm 3min_sub · axial · 1.2mm · 0.94mm/px · z∈[-72,+65]mm · 5 of 144 slices shown]
[im 1/144]
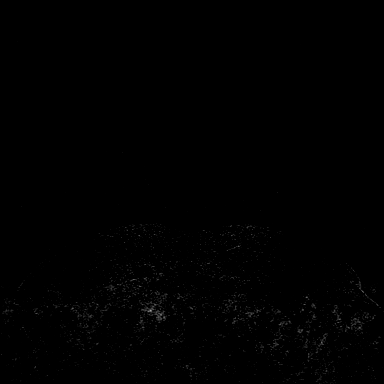
[im 29/144]
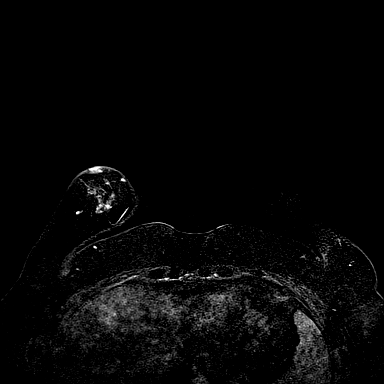
[im 58/144]
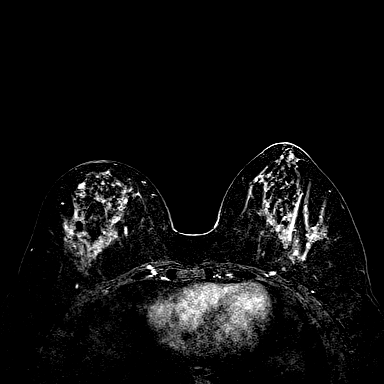
[im 86/144]
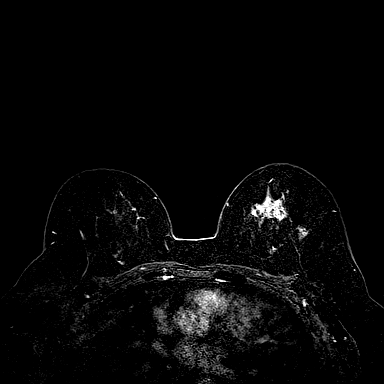
[im 115/144]
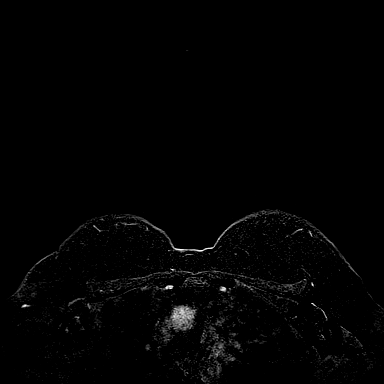

[33 of 48 positions shown; findings below may reference images not displayed]

Three-dimensional MR images were rendered by post-processing of the
original MR data on an independent workstation. The
three-dimensional MR images were interpreted, and findings are
reported in the following complete MRI report for this study. Three
dimensional images were evaluated at the independent [employer]
workstation
FINDINGS: Breast composition: c. Heterogeneous fibroglandular tissue.

Background parenchymal enhancement: Marked.

Right breast: No mass or abnormal enhancement.

Left breast: No mass or abnormal enhancement.

Lymph nodes: No abnormal appearing lymph nodes.

Ancillary findings:  None.
IMPRESSION: There is symmetric, marked parenchymal enhancement bilaterally,
limiting the sensitivity of MRI. Within these limitations, no
suspicious findings identified.

RECOMMENDATION:
Continued annual screening with mammography and breast MRI.

Patient is due for her next screening mammogram in April 2020.

BI-RADS CATEGORY  1: Negative.

## 2021-05-24 DIAGNOSIS — K219 Gastro-esophageal reflux disease without esophagitis: Secondary | ICD-10-CM | POA: Diagnosis not present

## 2021-05-24 DIAGNOSIS — Z833 Family history of diabetes mellitus: Secondary | ICD-10-CM | POA: Diagnosis not present

## 2021-05-24 DIAGNOSIS — K3184 Gastroparesis: Secondary | ICD-10-CM | POA: Diagnosis not present

## 2021-05-24 DIAGNOSIS — F5101 Primary insomnia: Secondary | ICD-10-CM | POA: Diagnosis not present

## 2021-05-24 DIAGNOSIS — Z9049 Acquired absence of other specified parts of digestive tract: Secondary | ICD-10-CM | POA: Diagnosis not present

## 2021-05-24 DIAGNOSIS — K582 Mixed irritable bowel syndrome: Secondary | ICD-10-CM | POA: Diagnosis not present

## 2021-05-24 DIAGNOSIS — E559 Vitamin D deficiency, unspecified: Secondary | ICD-10-CM | POA: Diagnosis not present

## 2021-05-24 DIAGNOSIS — Z13220 Encounter for screening for lipoid disorders: Secondary | ICD-10-CM | POA: Diagnosis not present

## 2021-05-24 DIAGNOSIS — E890 Postprocedural hypothyroidism: Secondary | ICD-10-CM | POA: Diagnosis not present

## 2021-05-24 DIAGNOSIS — Z0000 Encounter for general adult medical examination without abnormal findings: Secondary | ICD-10-CM | POA: Diagnosis not present

## 2021-06-06 DIAGNOSIS — Z1231 Encounter for screening mammogram for malignant neoplasm of breast: Secondary | ICD-10-CM | POA: Diagnosis not present

## 2021-06-06 DIAGNOSIS — Z01419 Encounter for gynecological examination (general) (routine) without abnormal findings: Secondary | ICD-10-CM | POA: Diagnosis not present

## 2021-06-06 DIAGNOSIS — Z6832 Body mass index (BMI) 32.0-32.9, adult: Secondary | ICD-10-CM | POA: Diagnosis not present

## 2021-06-08 ENCOUNTER — Other Ambulatory Visit (HOSPITAL_COMMUNITY): Payer: Self-pay

## 2021-06-10 ENCOUNTER — Encounter: Payer: 59 | Admitting: Family Medicine

## 2021-06-16 ENCOUNTER — Other Ambulatory Visit (HOSPITAL_COMMUNITY): Payer: Self-pay

## 2021-06-22 ENCOUNTER — Other Ambulatory Visit (HOSPITAL_COMMUNITY): Payer: Self-pay

## 2021-06-22 MED ORDER — METOCLOPRAMIDE HCL 5 MG PO TABS
5.0000 mg | ORAL_TABLET | Freq: Every evening | ORAL | 0 refills | Status: DC
  Filled 2021-06-22: qty 90, 90d supply, fill #0

## 2021-07-20 ENCOUNTER — Other Ambulatory Visit (HOSPITAL_COMMUNITY): Payer: Self-pay

## 2021-08-02 ENCOUNTER — Other Ambulatory Visit: Payer: Self-pay

## 2021-08-02 ENCOUNTER — Other Ambulatory Visit (HOSPITAL_COMMUNITY): Payer: Self-pay

## 2021-08-02 MED ORDER — LEVOTHYROXINE SODIUM 25 MCG PO TABS
25.0000 ug | ORAL_TABLET | Freq: Every morning | ORAL | 4 refills | Status: DC
  Filled 2021-08-02: qty 90, 90d supply, fill #0
  Filled 2021-11-15: qty 90, 90d supply, fill #1
  Filled 2022-03-09: qty 90, 90d supply, fill #2
  Filled 2022-06-21: qty 90, 90d supply, fill #3

## 2021-08-20 DIAGNOSIS — R12 Heartburn: Secondary | ICD-10-CM | POA: Diagnosis not present

## 2021-08-20 DIAGNOSIS — R110 Nausea: Secondary | ICD-10-CM | POA: Diagnosis not present

## 2021-08-20 DIAGNOSIS — R197 Diarrhea, unspecified: Secondary | ICD-10-CM | POA: Diagnosis not present

## 2021-08-20 DIAGNOSIS — R1084 Generalized abdominal pain: Secondary | ICD-10-CM | POA: Diagnosis not present

## 2021-08-24 DIAGNOSIS — R197 Diarrhea, unspecified: Secondary | ICD-10-CM | POA: Diagnosis not present

## 2021-08-24 DIAGNOSIS — R12 Heartburn: Secondary | ICD-10-CM | POA: Diagnosis not present

## 2021-08-24 DIAGNOSIS — R1084 Generalized abdominal pain: Secondary | ICD-10-CM | POA: Diagnosis not present

## 2021-08-24 DIAGNOSIS — R110 Nausea: Secondary | ICD-10-CM | POA: Diagnosis not present

## 2021-08-30 DIAGNOSIS — L570 Actinic keratosis: Secondary | ICD-10-CM | POA: Diagnosis not present

## 2021-08-30 DIAGNOSIS — D1801 Hemangioma of skin and subcutaneous tissue: Secondary | ICD-10-CM | POA: Diagnosis not present

## 2021-08-30 DIAGNOSIS — D225 Melanocytic nevi of trunk: Secondary | ICD-10-CM | POA: Diagnosis not present

## 2021-08-30 DIAGNOSIS — D2271 Melanocytic nevi of right lower limb, including hip: Secondary | ICD-10-CM | POA: Diagnosis not present

## 2021-08-30 DIAGNOSIS — H0261 Xanthelasma of right upper eyelid: Secondary | ICD-10-CM | POA: Diagnosis not present

## 2021-08-30 DIAGNOSIS — D2262 Melanocytic nevi of left upper limb, including shoulder: Secondary | ICD-10-CM | POA: Diagnosis not present

## 2021-08-30 DIAGNOSIS — D2272 Melanocytic nevi of left lower limb, including hip: Secondary | ICD-10-CM | POA: Diagnosis not present

## 2021-08-31 DIAGNOSIS — R1084 Generalized abdominal pain: Secondary | ICD-10-CM | POA: Diagnosis not present

## 2021-08-31 DIAGNOSIS — R110 Nausea: Secondary | ICD-10-CM | POA: Diagnosis not present

## 2021-08-31 DIAGNOSIS — R12 Heartburn: Secondary | ICD-10-CM | POA: Diagnosis not present

## 2021-08-31 DIAGNOSIS — R197 Diarrhea, unspecified: Secondary | ICD-10-CM | POA: Diagnosis not present

## 2021-09-02 ENCOUNTER — Other Ambulatory Visit (HOSPITAL_COMMUNITY): Payer: Self-pay

## 2021-09-02 MED ORDER — DRONABINOL 2.5 MG PO CAPS
2.5000 mg | ORAL_CAPSULE | Freq: Two times a day (BID) | ORAL | 0 refills | Status: DC
  Filled 2021-09-02: qty 60, 30d supply, fill #0

## 2021-09-05 ENCOUNTER — Other Ambulatory Visit (HOSPITAL_COMMUNITY): Payer: Self-pay

## 2021-09-08 DIAGNOSIS — R1084 Generalized abdominal pain: Secondary | ICD-10-CM | POA: Diagnosis not present

## 2021-09-08 DIAGNOSIS — R197 Diarrhea, unspecified: Secondary | ICD-10-CM | POA: Diagnosis not present

## 2021-09-08 DIAGNOSIS — R12 Heartburn: Secondary | ICD-10-CM | POA: Diagnosis not present

## 2021-09-08 DIAGNOSIS — R110 Nausea: Secondary | ICD-10-CM | POA: Diagnosis not present

## 2021-09-14 DIAGNOSIS — R12 Heartburn: Secondary | ICD-10-CM | POA: Diagnosis not present

## 2021-09-14 DIAGNOSIS — R1084 Generalized abdominal pain: Secondary | ICD-10-CM | POA: Diagnosis not present

## 2021-09-14 DIAGNOSIS — R110 Nausea: Secondary | ICD-10-CM | POA: Diagnosis not present

## 2021-09-14 DIAGNOSIS — R197 Diarrhea, unspecified: Secondary | ICD-10-CM | POA: Diagnosis not present

## 2021-09-21 DIAGNOSIS — R1084 Generalized abdominal pain: Secondary | ICD-10-CM | POA: Diagnosis not present

## 2021-09-21 DIAGNOSIS — R110 Nausea: Secondary | ICD-10-CM | POA: Diagnosis not present

## 2021-09-21 DIAGNOSIS — R12 Heartburn: Secondary | ICD-10-CM | POA: Diagnosis not present

## 2021-09-21 DIAGNOSIS — R197 Diarrhea, unspecified: Secondary | ICD-10-CM | POA: Diagnosis not present

## 2021-09-22 ENCOUNTER — Other Ambulatory Visit (HOSPITAL_COMMUNITY): Payer: Self-pay

## 2021-09-22 MED ORDER — METOCLOPRAMIDE HCL 5 MG PO TABS
5.0000 mg | ORAL_TABLET | Freq: Every evening | ORAL | 0 refills | Status: DC
  Filled 2021-09-22: qty 90, 90d supply, fill #0

## 2021-09-28 DIAGNOSIS — R197 Diarrhea, unspecified: Secondary | ICD-10-CM | POA: Diagnosis not present

## 2021-09-28 DIAGNOSIS — R110 Nausea: Secondary | ICD-10-CM | POA: Diagnosis not present

## 2021-09-28 DIAGNOSIS — R1084 Generalized abdominal pain: Secondary | ICD-10-CM | POA: Diagnosis not present

## 2021-09-28 DIAGNOSIS — R12 Heartburn: Secondary | ICD-10-CM | POA: Diagnosis not present

## 2021-10-05 DIAGNOSIS — R1084 Generalized abdominal pain: Secondary | ICD-10-CM | POA: Diagnosis not present

## 2021-10-05 DIAGNOSIS — R110 Nausea: Secondary | ICD-10-CM | POA: Diagnosis not present

## 2021-10-05 DIAGNOSIS — R12 Heartburn: Secondary | ICD-10-CM | POA: Diagnosis not present

## 2021-10-05 DIAGNOSIS — R197 Diarrhea, unspecified: Secondary | ICD-10-CM | POA: Diagnosis not present

## 2021-10-12 DIAGNOSIS — R110 Nausea: Secondary | ICD-10-CM | POA: Diagnosis not present

## 2021-10-12 DIAGNOSIS — R197 Diarrhea, unspecified: Secondary | ICD-10-CM | POA: Diagnosis not present

## 2021-10-12 DIAGNOSIS — R12 Heartburn: Secondary | ICD-10-CM | POA: Diagnosis not present

## 2021-10-12 DIAGNOSIS — R1084 Generalized abdominal pain: Secondary | ICD-10-CM | POA: Diagnosis not present

## 2021-10-19 DIAGNOSIS — R12 Heartburn: Secondary | ICD-10-CM | POA: Diagnosis not present

## 2021-10-19 DIAGNOSIS — R110 Nausea: Secondary | ICD-10-CM | POA: Diagnosis not present

## 2021-10-19 DIAGNOSIS — R197 Diarrhea, unspecified: Secondary | ICD-10-CM | POA: Diagnosis not present

## 2021-10-19 DIAGNOSIS — R1084 Generalized abdominal pain: Secondary | ICD-10-CM | POA: Diagnosis not present

## 2021-10-23 DIAGNOSIS — N3001 Acute cystitis with hematuria: Secondary | ICD-10-CM | POA: Diagnosis not present

## 2021-10-26 DIAGNOSIS — R12 Heartburn: Secondary | ICD-10-CM | POA: Diagnosis not present

## 2021-10-26 DIAGNOSIS — R110 Nausea: Secondary | ICD-10-CM | POA: Diagnosis not present

## 2021-10-26 DIAGNOSIS — R197 Diarrhea, unspecified: Secondary | ICD-10-CM | POA: Diagnosis not present

## 2021-10-26 DIAGNOSIS — R1084 Generalized abdominal pain: Secondary | ICD-10-CM | POA: Diagnosis not present

## 2021-10-31 DIAGNOSIS — E755 Other lipid storage disorders: Secondary | ICD-10-CM | POA: Diagnosis not present

## 2021-10-31 DIAGNOSIS — L578 Other skin changes due to chronic exposure to nonionizing radiation: Secondary | ICD-10-CM | POA: Diagnosis not present

## 2021-10-31 DIAGNOSIS — L570 Actinic keratosis: Secondary | ICD-10-CM | POA: Diagnosis not present

## 2021-11-02 DIAGNOSIS — R197 Diarrhea, unspecified: Secondary | ICD-10-CM | POA: Diagnosis not present

## 2021-11-02 DIAGNOSIS — R110 Nausea: Secondary | ICD-10-CM | POA: Diagnosis not present

## 2021-11-02 DIAGNOSIS — R1084 Generalized abdominal pain: Secondary | ICD-10-CM | POA: Diagnosis not present

## 2021-11-02 DIAGNOSIS — R12 Heartburn: Secondary | ICD-10-CM | POA: Diagnosis not present

## 2021-11-03 ENCOUNTER — Other Ambulatory Visit (HOSPITAL_COMMUNITY): Payer: Self-pay

## 2021-11-03 MED ORDER — DICYCLOMINE HCL 10 MG PO CAPS
10.0000 mg | ORAL_CAPSULE | Freq: Three times a day (TID) | ORAL | 1 refills | Status: DC | PRN
  Filled 2021-11-03: qty 270, 90d supply, fill #0
  Filled 2021-11-03: qty 79, 27d supply, fill #0
  Filled 2021-11-03: qty 270, 90d supply, fill #0
  Filled 2022-03-09: qty 270, 90d supply, fill #1

## 2021-11-04 ENCOUNTER — Other Ambulatory Visit (HOSPITAL_COMMUNITY): Payer: Self-pay

## 2021-11-04 DIAGNOSIS — N390 Urinary tract infection, site not specified: Secondary | ICD-10-CM | POA: Diagnosis not present

## 2021-11-04 DIAGNOSIS — R030 Elevated blood-pressure reading, without diagnosis of hypertension: Secondary | ICD-10-CM | POA: Diagnosis not present

## 2021-11-04 MED ORDER — SULFAMETHOXAZOLE-TRIMETHOPRIM 800-160 MG PO TABS
1.0000 | ORAL_TABLET | Freq: Two times a day (BID) | ORAL | 0 refills | Status: DC
  Filled 2021-11-04: qty 14, 7d supply, fill #0

## 2021-11-09 IMAGING — US US THYROID
1 series · 13 of 25 positions shown · non-contrast
Comparison: 01/14/2018; 01/01/2017; 12/22/2014; right thyroid
nodule fine-needle aspiration-01/20/2015

CLINICAL DATA: Prior ultrasound follow-up. History of left-sided
thyroid lobectomy and biopsy of right lower pole thyroid nodule on
01/20/2015.

EXAM:
THYROID ULTRASOUND
TECHNIQUE: Ultrasound examination of the thyroid gland and adjacent soft
tissues was performed.

[Series 1: us thyroid · 0.07mm/px · 13 of 37 slices shown]
[im 1/37]
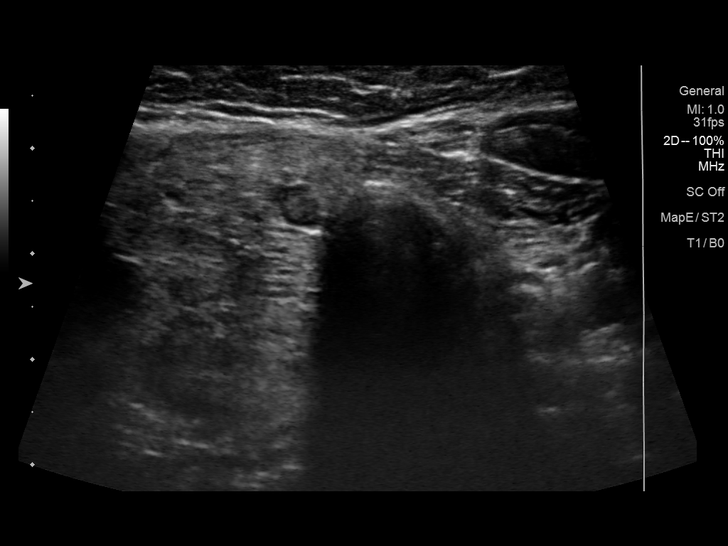
[im 4/37]
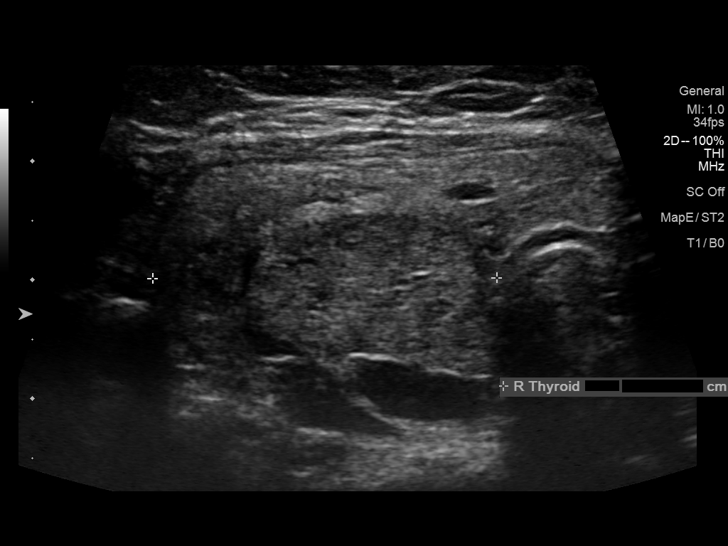
[im 7/37]
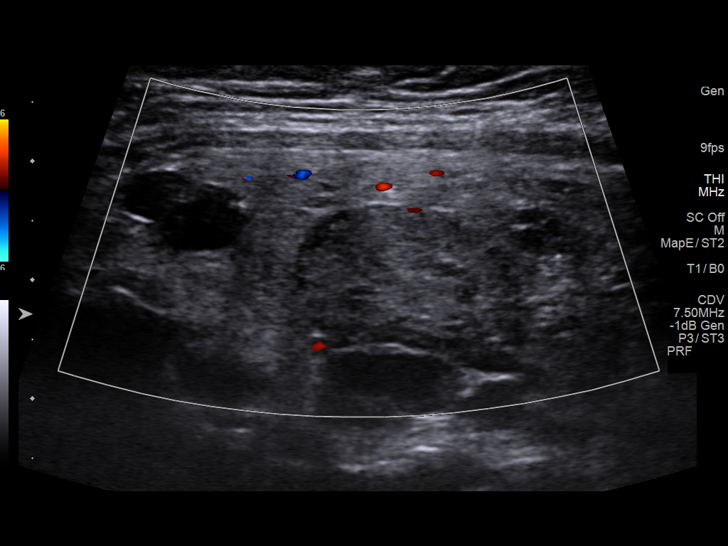
[im 10/37]
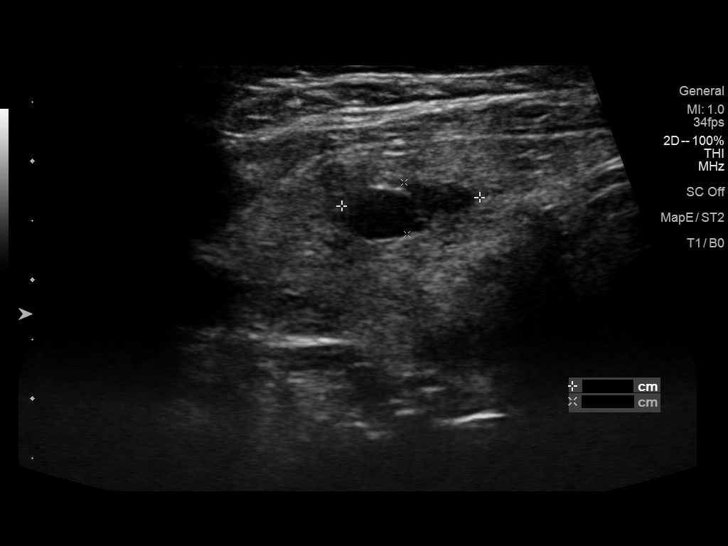
[im 13/37]
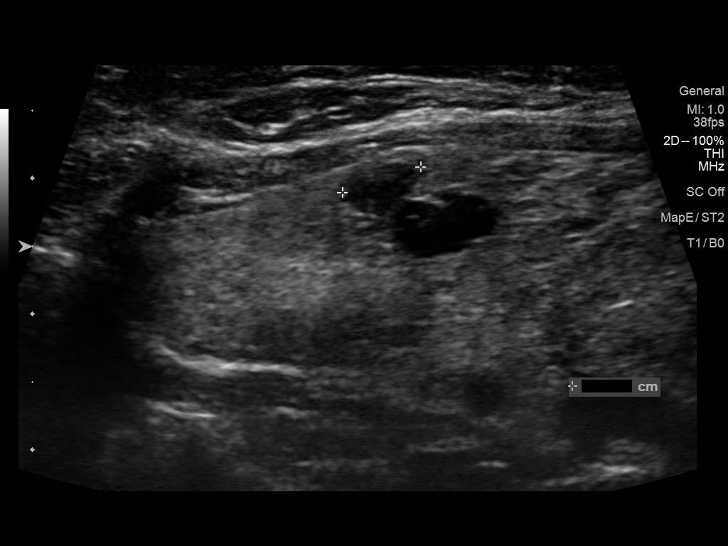
[im 16/37]
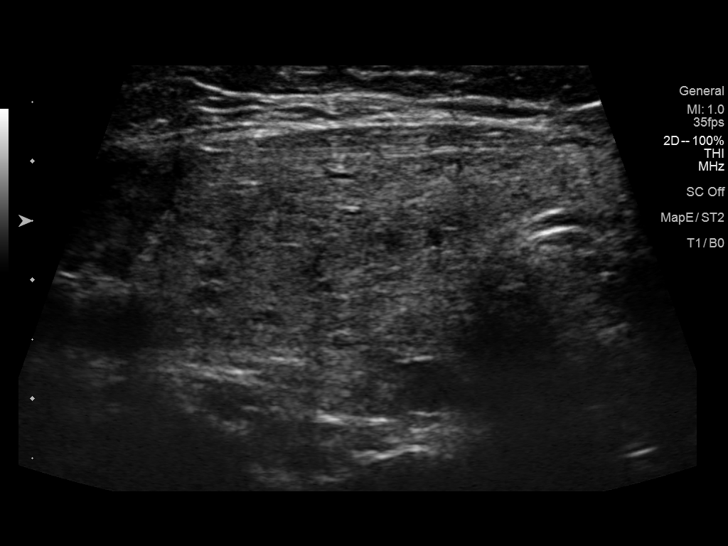
[im 19/37]
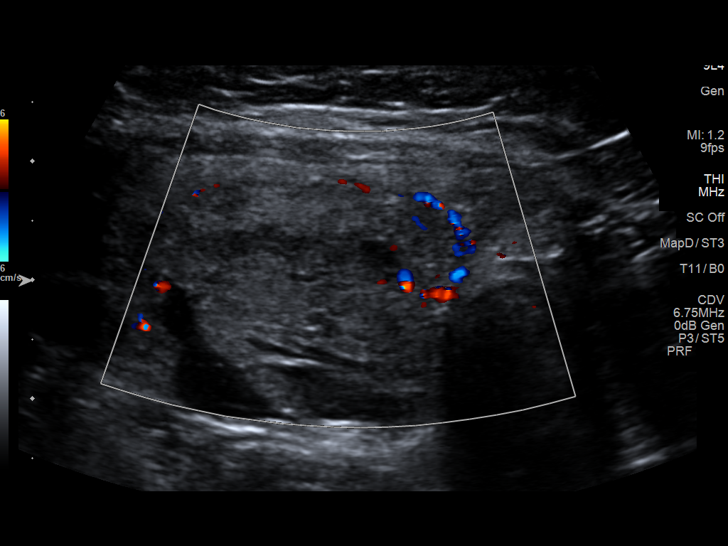
[im 22/37]
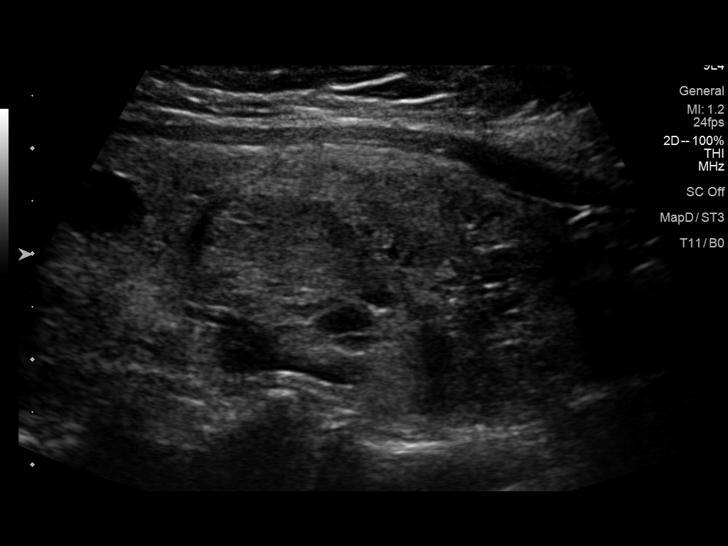
[im 25/37]
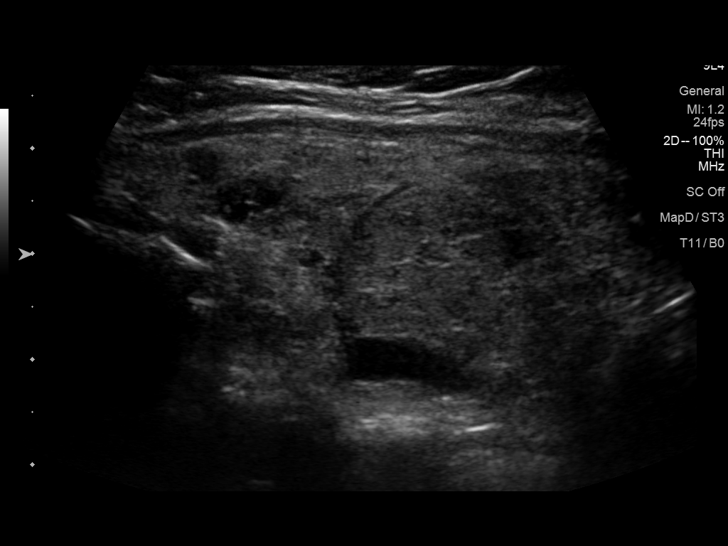
[im 28/37]
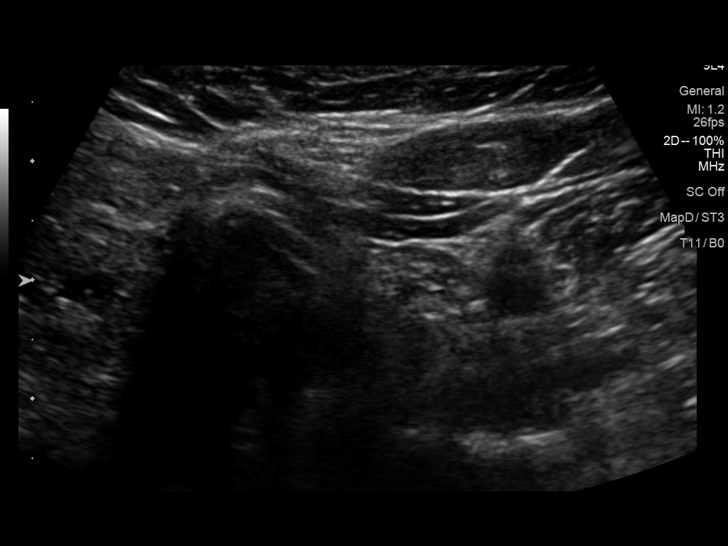
[im 31/37]
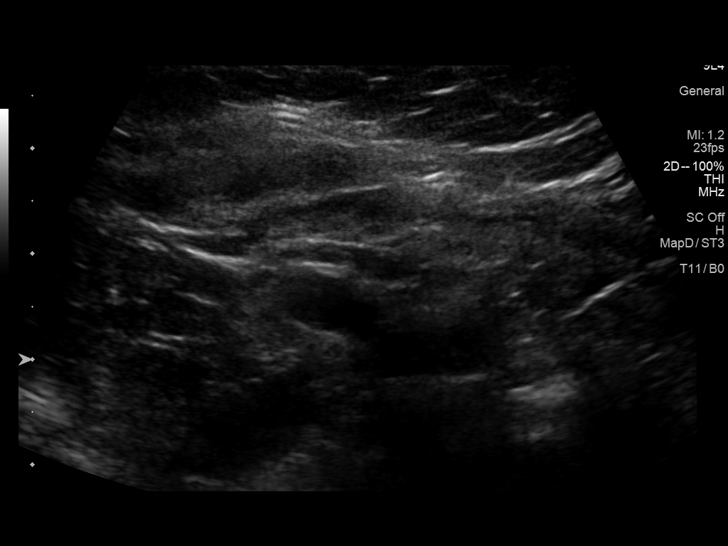
[im 34/37]
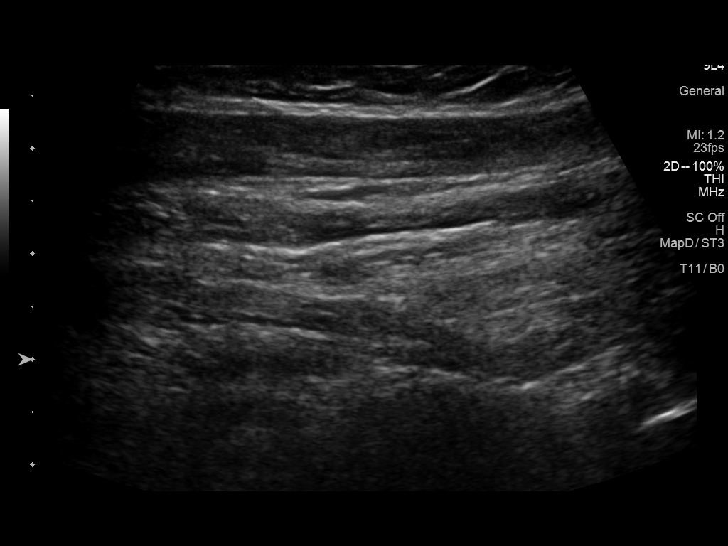
[im 37/37]
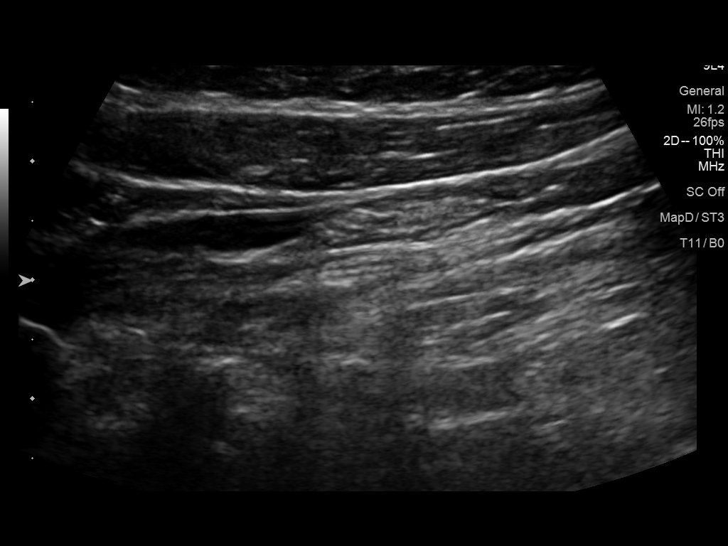

[13 of 25 positions shown; findings below may reference images not displayed]

FINDINGS: Parenchymal Echotexture: Moderately heterogenous

Isthmus: Normal in size measures 0.4 cm in diameter, unchanged

Right lobe: Enlarged measuring 6.3 x 2.2 x 2.9 cm, previously, 6.3 x
2.3 x 2.8 cm

Left lobe: Surgically absent. There is no residual nodular soft
tissue within the left lobectomy resection bed.

_________________________________________________________

Estimated total number of nodules >/= 1 cm: 2

Number of spongiform nodules >/=  2 cm not described below (TR1): 0

Number of mixed cystic and solid nodules >/= 1.5 cm not described
below (TR2): 0

_________________________________________________________

Scattered punctate (sub 0.8 cm) cysts within the right lobe of the
thyroid are grossly unchanged compared to the [DATE] examination,
and again do not meet imaging criteria to recommend percutaneous
sampling or continued dedicated follow-up

Previously biopsied approximately 3.6 x 3.1 x 2.2 cm isoechoic
nodule/mass within the inferior pole the right lobe of the thyroid
appears similar to the [DATE] examination, previously, 3.7 x 2.0 x
2.1 cm, with slight size differences attributable to interval
partial cystic degeneration, a typically benign finding. Correlation
with previous biopsy results is advised.
IMPRESSION: 1. Post left thyroid lobectomy without evidence of locally recurrent
or residual disease.
2. Similar findings of multinodular goiter within the remaining
thyroid parenchyma. No new or enlarging thyroid nodules.
3. Previously biopsied 3.6 cm isoechoic nodule/mass within the right
lobe of the thyroid is grossly unchanged compared to the 8357
examination. Correlation previous biopsy results is advised.
Assuming a benign pathologic diagnosis, repeat sampling and/or
continued dedicated follow-up is not recommended.

The above is in keeping with the ACR TI-RADS recommendations - [HOSPITAL] 0309;[DATE].

## 2021-11-10 DIAGNOSIS — R197 Diarrhea, unspecified: Secondary | ICD-10-CM | POA: Diagnosis not present

## 2021-11-10 DIAGNOSIS — R110 Nausea: Secondary | ICD-10-CM | POA: Diagnosis not present

## 2021-11-10 DIAGNOSIS — R1084 Generalized abdominal pain: Secondary | ICD-10-CM | POA: Diagnosis not present

## 2021-11-10 DIAGNOSIS — R12 Heartburn: Secondary | ICD-10-CM | POA: Diagnosis not present

## 2021-11-16 ENCOUNTER — Other Ambulatory Visit (HOSPITAL_COMMUNITY): Payer: Self-pay

## 2021-11-16 DIAGNOSIS — R197 Diarrhea, unspecified: Secondary | ICD-10-CM | POA: Diagnosis not present

## 2021-11-16 DIAGNOSIS — R12 Heartburn: Secondary | ICD-10-CM | POA: Diagnosis not present

## 2021-11-16 DIAGNOSIS — R1084 Generalized abdominal pain: Secondary | ICD-10-CM | POA: Diagnosis not present

## 2021-11-16 DIAGNOSIS — R110 Nausea: Secondary | ICD-10-CM | POA: Diagnosis not present

## 2021-11-30 ENCOUNTER — Other Ambulatory Visit: Payer: Self-pay | Admitting: Endocrinology

## 2021-11-30 DIAGNOSIS — R12 Heartburn: Secondary | ICD-10-CM | POA: Diagnosis not present

## 2021-11-30 DIAGNOSIS — R110 Nausea: Secondary | ICD-10-CM | POA: Diagnosis not present

## 2021-11-30 DIAGNOSIS — R1084 Generalized abdominal pain: Secondary | ICD-10-CM | POA: Diagnosis not present

## 2021-11-30 DIAGNOSIS — R197 Diarrhea, unspecified: Secondary | ICD-10-CM | POA: Diagnosis not present

## 2021-11-30 DIAGNOSIS — E041 Nontoxic single thyroid nodule: Secondary | ICD-10-CM

## 2021-12-05 ENCOUNTER — Ambulatory Visit
Admission: RE | Admit: 2021-12-05 | Discharge: 2021-12-05 | Disposition: A | Discharge status: Home / Self Care | Payer: 59 | Source: Ambulatory Visit | Attending: Endocrinology | Admitting: Endocrinology

## 2021-12-05 DIAGNOSIS — E890 Postprocedural hypothyroidism: Secondary | ICD-10-CM | POA: Diagnosis not present

## 2021-12-05 DIAGNOSIS — E041 Nontoxic single thyroid nodule: Secondary | ICD-10-CM

## 2021-12-07 DIAGNOSIS — R12 Heartburn: Secondary | ICD-10-CM | POA: Diagnosis not present

## 2021-12-07 DIAGNOSIS — R1084 Generalized abdominal pain: Secondary | ICD-10-CM | POA: Diagnosis not present

## 2021-12-07 DIAGNOSIS — R110 Nausea: Secondary | ICD-10-CM | POA: Diagnosis not present

## 2021-12-07 DIAGNOSIS — R197 Diarrhea, unspecified: Secondary | ICD-10-CM | POA: Diagnosis not present

## 2021-12-08 ENCOUNTER — Other Ambulatory Visit (HOSPITAL_COMMUNITY): Payer: Self-pay

## 2021-12-14 DIAGNOSIS — R1084 Generalized abdominal pain: Secondary | ICD-10-CM | POA: Diagnosis not present

## 2021-12-14 DIAGNOSIS — R12 Heartburn: Secondary | ICD-10-CM | POA: Diagnosis not present

## 2021-12-14 DIAGNOSIS — R110 Nausea: Secondary | ICD-10-CM | POA: Diagnosis not present

## 2021-12-14 DIAGNOSIS — R197 Diarrhea, unspecified: Secondary | ICD-10-CM | POA: Diagnosis not present

## 2021-12-20 ENCOUNTER — Other Ambulatory Visit (HOSPITAL_COMMUNITY): Payer: Self-pay

## 2021-12-20 DIAGNOSIS — K3184 Gastroparesis: Secondary | ICD-10-CM | POA: Diagnosis not present

## 2021-12-20 DIAGNOSIS — K219 Gastro-esophageal reflux disease without esophagitis: Secondary | ICD-10-CM | POA: Diagnosis not present

## 2021-12-20 MED ORDER — FAMOTIDINE 40 MG PO TABS
40.0000 mg | ORAL_TABLET | Freq: Every day | ORAL | 3 refills | Status: DC
  Filled 2021-12-20: qty 90, 90d supply, fill #0

## 2021-12-20 MED ORDER — METOCLOPRAMIDE HCL 5 MG PO TABS
5.0000 mg | ORAL_TABLET | Freq: Every evening | ORAL | 3 refills | Status: DC
  Filled 2021-12-20: qty 90, 90d supply, fill #0
  Filled 2022-03-28: qty 90, 90d supply, fill #1
  Filled 2022-06-21: qty 90, 90d supply, fill #2
  Filled 2022-09-20: qty 90, 90d supply, fill #3

## 2021-12-21 DIAGNOSIS — R12 Heartburn: Secondary | ICD-10-CM | POA: Diagnosis not present

## 2021-12-21 DIAGNOSIS — R197 Diarrhea, unspecified: Secondary | ICD-10-CM | POA: Diagnosis not present

## 2021-12-21 DIAGNOSIS — R110 Nausea: Secondary | ICD-10-CM | POA: Diagnosis not present

## 2021-12-21 DIAGNOSIS — R1084 Generalized abdominal pain: Secondary | ICD-10-CM | POA: Diagnosis not present

## 2021-12-29 DIAGNOSIS — E890 Postprocedural hypothyroidism: Secondary | ICD-10-CM | POA: Diagnosis not present

## 2021-12-29 DIAGNOSIS — E041 Nontoxic single thyroid nodule: Secondary | ICD-10-CM | POA: Diagnosis not present

## 2022-01-05 ENCOUNTER — Other Ambulatory Visit (HOSPITAL_COMMUNITY): Payer: Self-pay

## 2022-01-05 DIAGNOSIS — E041 Nontoxic single thyroid nodule: Secondary | ICD-10-CM | POA: Diagnosis not present

## 2022-01-05 DIAGNOSIS — E890 Postprocedural hypothyroidism: Secondary | ICD-10-CM | POA: Diagnosis not present

## 2022-01-05 MED ORDER — LEVOTHYROXINE SODIUM 25 MCG PO TABS
25.0000 ug | ORAL_TABLET | Freq: Every morning | ORAL | 4 refills | Status: DC
  Filled 2022-01-05: qty 90, 90d supply, fill #0

## 2022-01-24 ENCOUNTER — Other Ambulatory Visit (HOSPITAL_COMMUNITY): Payer: Self-pay

## 2022-01-24 MED ORDER — DRONABINOL 2.5 MG PO CAPS
2.5000 mg | ORAL_CAPSULE | Freq: Two times a day (BID) | ORAL | 3 refills | Status: DC
  Filled 2022-01-24 – 2022-01-30 (×2): qty 60, 30d supply, fill #0
  Filled 2022-04-12: qty 60, 30d supply, fill #1
  Filled 2022-06-28: qty 60, 30d supply, fill #2

## 2022-01-25 ENCOUNTER — Other Ambulatory Visit (HOSPITAL_COMMUNITY): Payer: Self-pay

## 2022-01-30 ENCOUNTER — Other Ambulatory Visit (HOSPITAL_COMMUNITY): Payer: Self-pay

## 2022-01-31 ENCOUNTER — Other Ambulatory Visit (HOSPITAL_COMMUNITY): Payer: Self-pay

## 2022-02-01 ENCOUNTER — Other Ambulatory Visit (HOSPITAL_COMMUNITY): Payer: Self-pay

## 2022-03-09 ENCOUNTER — Other Ambulatory Visit (HOSPITAL_COMMUNITY): Payer: Self-pay

## 2022-03-09 MED ORDER — RABEPRAZOLE SODIUM 20 MG PO TBEC
20.0000 mg | DELAYED_RELEASE_TABLET | Freq: Two times a day (BID) | ORAL | 3 refills | Status: DC
  Filled 2022-03-09: qty 180, 90d supply, fill #0
  Filled 2022-06-05: qty 180, 90d supply, fill #1
  Filled 2022-09-13: qty 180, 90d supply, fill #2
  Filled 2022-12-08: qty 180, 90d supply, fill #3

## 2022-03-10 ENCOUNTER — Other Ambulatory Visit (HOSPITAL_COMMUNITY): Payer: Self-pay

## 2022-03-13 ENCOUNTER — Other Ambulatory Visit (HOSPITAL_COMMUNITY): Payer: Self-pay

## 2022-03-28 ENCOUNTER — Other Ambulatory Visit (HOSPITAL_COMMUNITY): Payer: Self-pay

## 2022-03-30 DIAGNOSIS — H9202 Otalgia, left ear: Secondary | ICD-10-CM | POA: Diagnosis not present

## 2022-03-30 DIAGNOSIS — Z03818 Encounter for observation for suspected exposure to other biological agents ruled out: Secondary | ICD-10-CM | POA: Diagnosis not present

## 2022-03-30 DIAGNOSIS — R0981 Nasal congestion: Secondary | ICD-10-CM | POA: Diagnosis not present

## 2022-04-13 ENCOUNTER — Other Ambulatory Visit (HOSPITAL_COMMUNITY): Payer: Self-pay

## 2022-04-14 ENCOUNTER — Other Ambulatory Visit (HOSPITAL_COMMUNITY): Payer: Self-pay

## 2022-05-08 ENCOUNTER — Encounter: Payer: Self-pay | Admitting: *Deleted

## 2022-05-16 DIAGNOSIS — M25511 Pain in right shoulder: Secondary | ICD-10-CM | POA: Diagnosis not present

## 2022-05-24 DIAGNOSIS — R12 Heartburn: Secondary | ICD-10-CM | POA: Diagnosis not present

## 2022-05-24 DIAGNOSIS — R1084 Generalized abdominal pain: Secondary | ICD-10-CM | POA: Diagnosis not present

## 2022-05-24 DIAGNOSIS — R110 Nausea: Secondary | ICD-10-CM | POA: Diagnosis not present

## 2022-05-24 DIAGNOSIS — R197 Diarrhea, unspecified: Secondary | ICD-10-CM | POA: Diagnosis not present

## 2022-05-31 DIAGNOSIS — R197 Diarrhea, unspecified: Secondary | ICD-10-CM | POA: Diagnosis not present

## 2022-05-31 DIAGNOSIS — R110 Nausea: Secondary | ICD-10-CM | POA: Diagnosis not present

## 2022-05-31 DIAGNOSIS — R12 Heartburn: Secondary | ICD-10-CM | POA: Diagnosis not present

## 2022-05-31 DIAGNOSIS — R1084 Generalized abdominal pain: Secondary | ICD-10-CM | POA: Diagnosis not present

## 2022-06-01 ENCOUNTER — Other Ambulatory Visit (HOSPITAL_COMMUNITY): Payer: Self-pay

## 2022-06-01 DIAGNOSIS — M542 Cervicalgia: Secondary | ICD-10-CM | POA: Diagnosis not present

## 2022-06-01 MED ORDER — METHOCARBAMOL 750 MG PO TABS
750.0000 mg | ORAL_TABLET | Freq: Two times a day (BID) | ORAL | 0 refills | Status: DC
  Filled 2022-06-01: qty 14, 7d supply, fill #0

## 2022-06-05 ENCOUNTER — Other Ambulatory Visit (HOSPITAL_COMMUNITY): Payer: Self-pay

## 2022-06-05 DIAGNOSIS — Z13220 Encounter for screening for lipoid disorders: Secondary | ICD-10-CM | POA: Diagnosis not present

## 2022-06-05 DIAGNOSIS — Z131 Encounter for screening for diabetes mellitus: Secondary | ICD-10-CM | POA: Diagnosis not present

## 2022-06-05 DIAGNOSIS — E559 Vitamin D deficiency, unspecified: Secondary | ICD-10-CM | POA: Diagnosis not present

## 2022-06-05 DIAGNOSIS — Z23 Encounter for immunization: Secondary | ICD-10-CM | POA: Diagnosis not present

## 2022-06-05 DIAGNOSIS — E890 Postprocedural hypothyroidism: Secondary | ICD-10-CM | POA: Diagnosis not present

## 2022-06-05 DIAGNOSIS — K3184 Gastroparesis: Secondary | ICD-10-CM | POA: Diagnosis not present

## 2022-06-05 DIAGNOSIS — K219 Gastro-esophageal reflux disease without esophagitis: Secondary | ICD-10-CM | POA: Diagnosis not present

## 2022-06-05 DIAGNOSIS — E669 Obesity, unspecified: Secondary | ICD-10-CM | POA: Diagnosis not present

## 2022-06-05 DIAGNOSIS — K582 Mixed irritable bowel syndrome: Secondary | ICD-10-CM | POA: Diagnosis not present

## 2022-06-05 DIAGNOSIS — Z0000 Encounter for general adult medical examination without abnormal findings: Secondary | ICD-10-CM | POA: Diagnosis not present

## 2022-06-07 DIAGNOSIS — R197 Diarrhea, unspecified: Secondary | ICD-10-CM | POA: Diagnosis not present

## 2022-06-07 DIAGNOSIS — R110 Nausea: Secondary | ICD-10-CM | POA: Diagnosis not present

## 2022-06-07 DIAGNOSIS — R12 Heartburn: Secondary | ICD-10-CM | POA: Diagnosis not present

## 2022-06-07 DIAGNOSIS — R1084 Generalized abdominal pain: Secondary | ICD-10-CM | POA: Diagnosis not present

## 2022-06-12 DIAGNOSIS — M542 Cervicalgia: Secondary | ICD-10-CM | POA: Diagnosis not present

## 2022-06-12 DIAGNOSIS — S161XXD Strain of muscle, fascia and tendon at neck level, subsequent encounter: Secondary | ICD-10-CM | POA: Diagnosis not present

## 2022-06-13 DIAGNOSIS — Z6831 Body mass index (BMI) 31.0-31.9, adult: Secondary | ICD-10-CM | POA: Diagnosis not present

## 2022-06-13 DIAGNOSIS — Z1272 Encounter for screening for malignant neoplasm of vagina: Secondary | ICD-10-CM | POA: Diagnosis not present

## 2022-06-13 DIAGNOSIS — Z01419 Encounter for gynecological examination (general) (routine) without abnormal findings: Secondary | ICD-10-CM | POA: Diagnosis not present

## 2022-06-13 DIAGNOSIS — Z1231 Encounter for screening mammogram for malignant neoplasm of breast: Secondary | ICD-10-CM | POA: Diagnosis not present

## 2022-06-14 DIAGNOSIS — R110 Nausea: Secondary | ICD-10-CM | POA: Diagnosis not present

## 2022-06-14 DIAGNOSIS — R197 Diarrhea, unspecified: Secondary | ICD-10-CM | POA: Diagnosis not present

## 2022-06-14 DIAGNOSIS — R1084 Generalized abdominal pain: Secondary | ICD-10-CM | POA: Diagnosis not present

## 2022-06-14 DIAGNOSIS — R12 Heartburn: Secondary | ICD-10-CM | POA: Diagnosis not present

## 2022-06-16 DIAGNOSIS — Z1211 Encounter for screening for malignant neoplasm of colon: Secondary | ICD-10-CM | POA: Diagnosis not present

## 2022-06-22 ENCOUNTER — Other Ambulatory Visit (HOSPITAL_COMMUNITY): Payer: Self-pay

## 2022-06-22 DIAGNOSIS — S161XXD Strain of muscle, fascia and tendon at neck level, subsequent encounter: Secondary | ICD-10-CM | POA: Diagnosis not present

## 2022-06-22 DIAGNOSIS — M542 Cervicalgia: Secondary | ICD-10-CM | POA: Diagnosis not present

## 2022-06-26 IMAGING — US US BREAST*R* LIMITED INC AXILLA
1 series · 10 of 10 positions shown · non-contrast
Comparison: Previous exam(s).

CLINICAL DATA: 49-year-old female presenting as a recall from
screening for possible right breast masses.

EXAM:
DIGITAL DIAGNOSTIC RIGHT MAMMOGRAM WITH TOMO
ULTRASOUND RIGHT BREAST

[Series 1: us breast*right* limited inc axilla · 0.07mm/px · 10 of 10 slices shown]
[im 1/10]
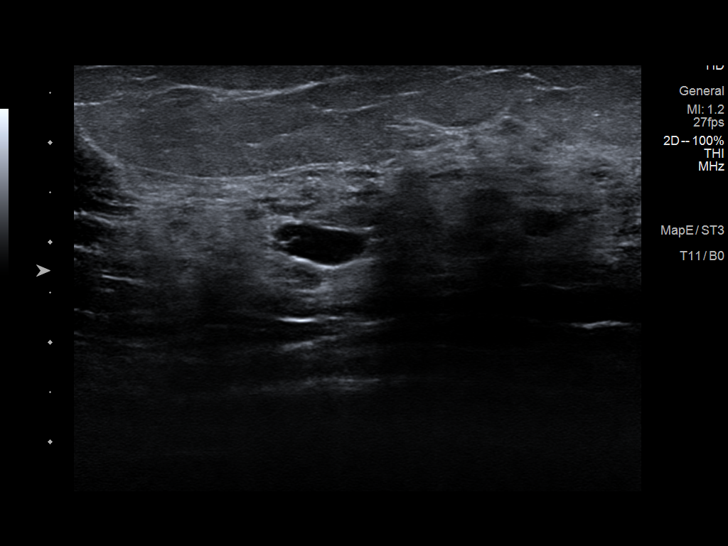
[im 2/10]
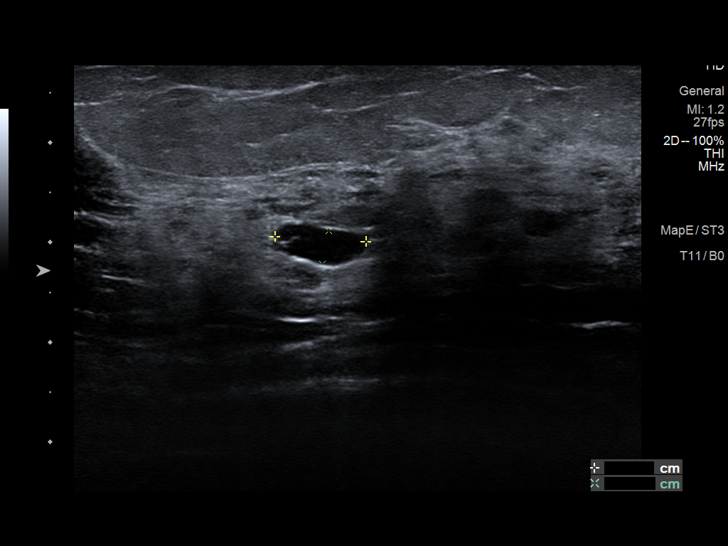
[im 3/10]
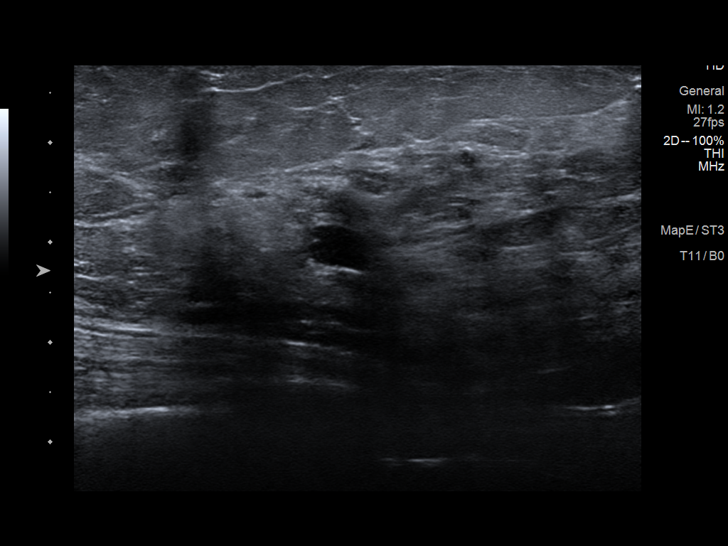
[im 4/10]
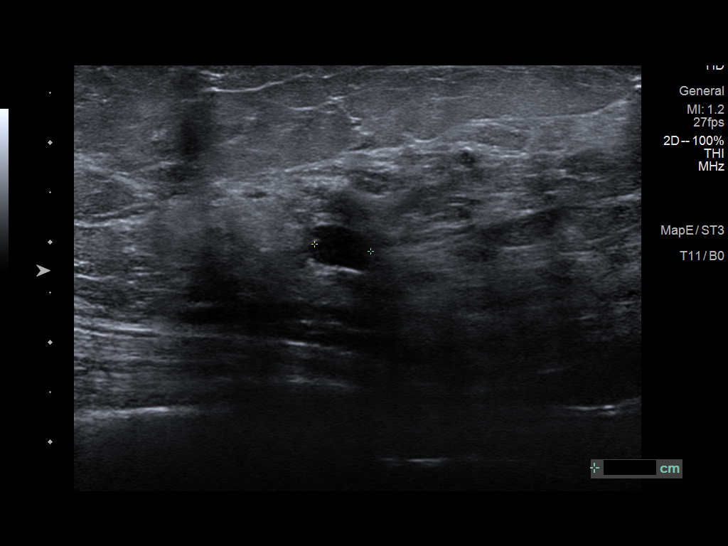
[im 5/10]
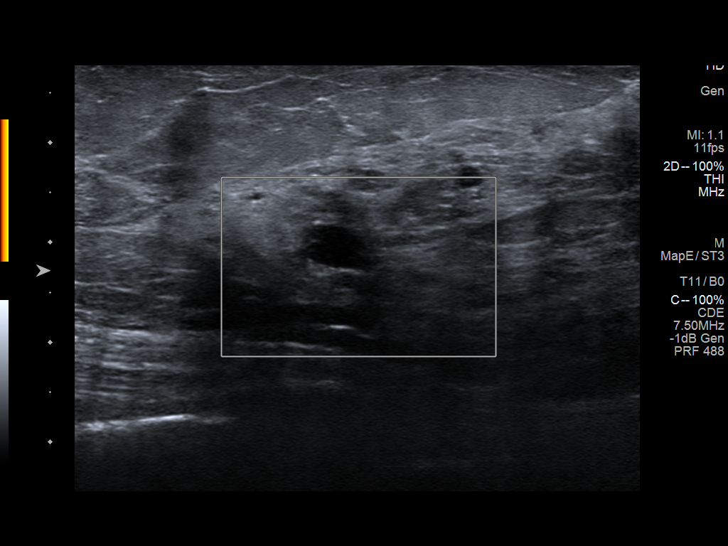
[im 6/10]
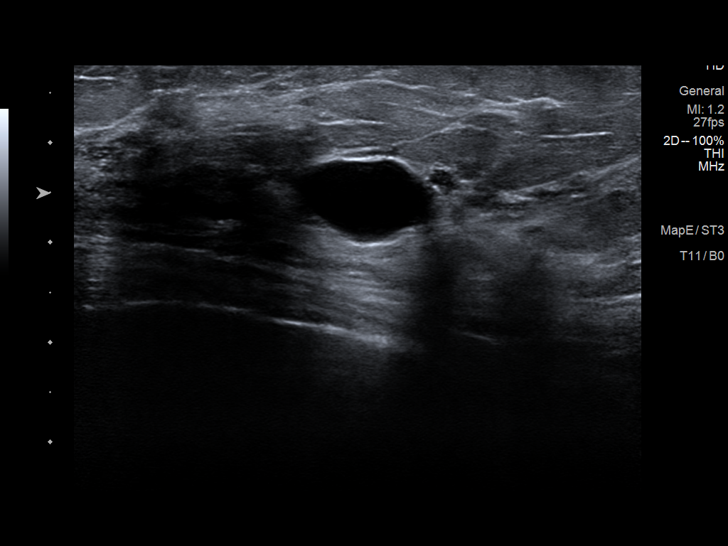
[im 7/10]
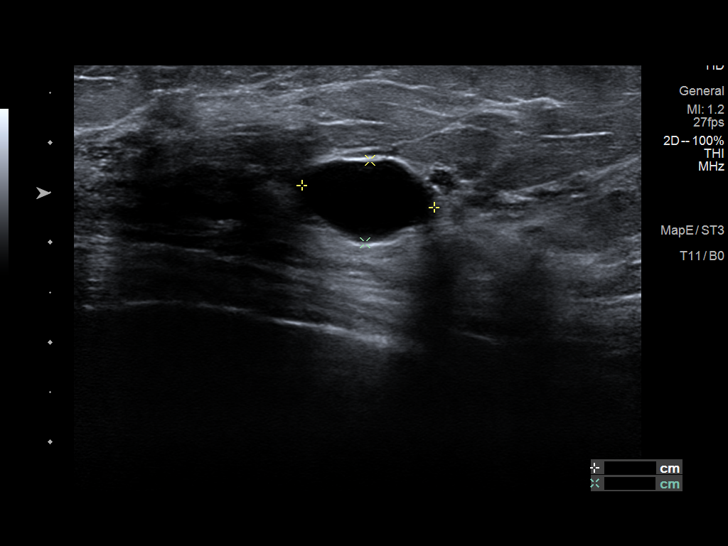
[im 8/10]
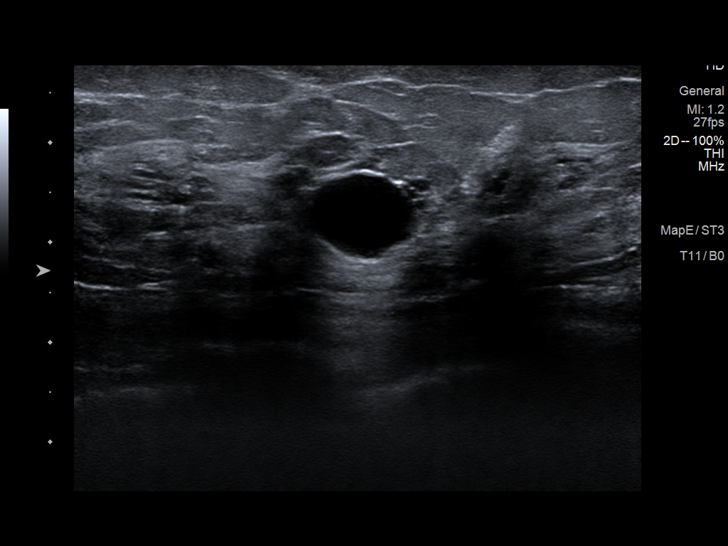
[im 9/10]
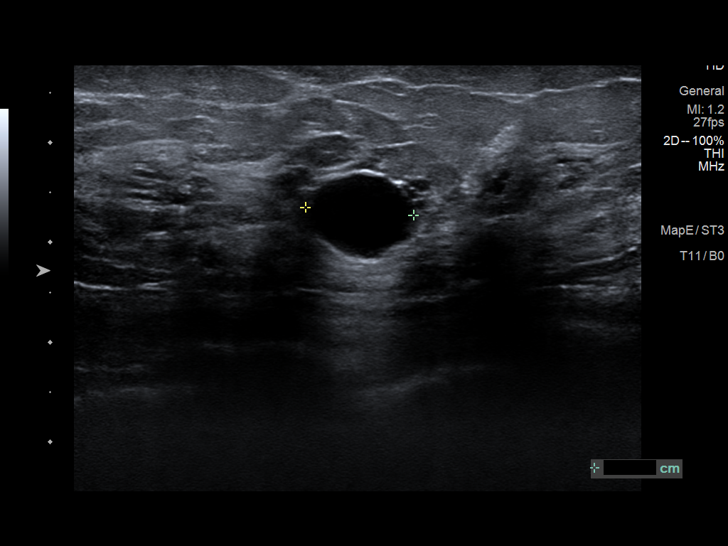
[im 10/10]
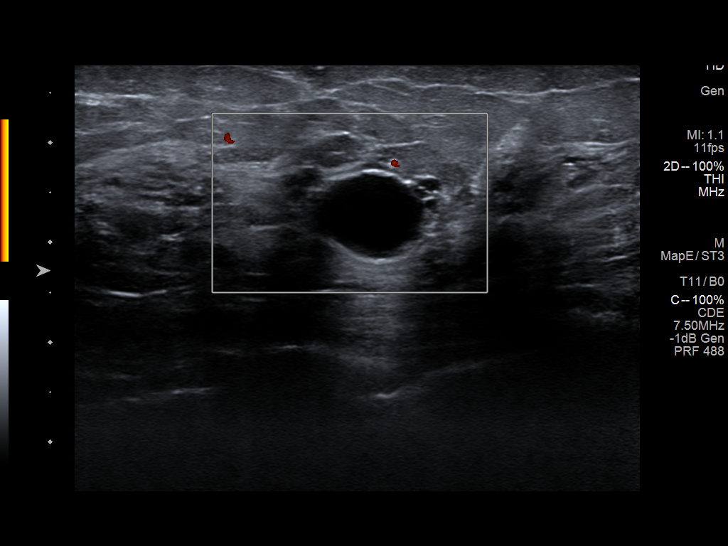

[10 of 10 positions shown; findings below may reference images not displayed]

ACR Breast Density Category c: The breast tissue is heterogeneously
dense, which may obscure small masses.
FINDINGS: Mammogram:

Spot compression tomosynthesis views of the right breast were
performed demonstrating persistence of an obscured mass in the upper
outer quadrant of the right breast measuring approximately 1.3 cm.
There is an additional smaller similar mass slightly medial and
inferior to this best seen on the MLO spot measuring approximately
0.6 cm.

Ultrasound:

Targeted ultrasound is performed in the right breast at 9:30 o'clock
6 cm from nipple demonstrating an oval circumscribed anechoic mass
measuring 1.3 x 0.8 x 1.1 cm, consistent with a benign simple cyst.
This corresponds to the mass identified mammographically.

Targeted ultrasound is performed in the right breast at 11 o'clock 3
cm from nipple demonstrating an oval circumscribed anechoic mass
measuring 0.9 x 0.3 x 0.6 cm, consistent with a benign simple cyst.
This corresponds to the second smaller obscured mass.
IMPRESSION: Right breast masses at 9:30 o'clock and 11 o'clock are consistent
with benign simple cysts.

RECOMMENDATION:
Return to routine annual screening mammography which will be due in
May 2021.

I have discussed the findings and recommendations with the patient.
If applicable, a reminder letter will be sent to the patient
regarding the next appointment.

BI-RADS CATEGORY  2: Benign.

## 2022-06-28 ENCOUNTER — Other Ambulatory Visit (HOSPITAL_COMMUNITY): Payer: Self-pay

## 2022-06-29 DIAGNOSIS — M542 Cervicalgia: Secondary | ICD-10-CM | POA: Diagnosis not present

## 2022-06-29 DIAGNOSIS — S161XXD Strain of muscle, fascia and tendon at neck level, subsequent encounter: Secondary | ICD-10-CM | POA: Diagnosis not present

## 2022-07-13 DIAGNOSIS — H524 Presbyopia: Secondary | ICD-10-CM | POA: Diagnosis not present

## 2022-07-19 ENCOUNTER — Other Ambulatory Visit (HOSPITAL_COMMUNITY): Payer: Self-pay

## 2022-07-19 MED ORDER — ONDANSETRON 4 MG PO TBDP
4.0000 mg | ORAL_TABLET | Freq: Three times a day (TID) | ORAL | 1 refills | Status: DC | PRN
  Filled 2022-07-19: qty 180, 60d supply, fill #0
  Filled 2023-02-19: qty 180, 60d supply, fill #1

## 2022-07-19 MED ORDER — DICYCLOMINE HCL 10 MG PO CAPS
10.0000 mg | ORAL_CAPSULE | Freq: Three times a day (TID) | ORAL | 1 refills | Status: DC | PRN
  Filled 2022-07-19: qty 270, 90d supply, fill #0
  Filled 2022-11-29: qty 270, 90d supply, fill #1

## 2022-07-20 ENCOUNTER — Other Ambulatory Visit (HOSPITAL_COMMUNITY): Payer: Self-pay

## 2022-07-25 DIAGNOSIS — S161XXD Strain of muscle, fascia and tendon at neck level, subsequent encounter: Secondary | ICD-10-CM | POA: Diagnosis not present

## 2022-07-25 DIAGNOSIS — M542 Cervicalgia: Secondary | ICD-10-CM | POA: Diagnosis not present

## 2022-08-02 DIAGNOSIS — M542 Cervicalgia: Secondary | ICD-10-CM | POA: Diagnosis not present

## 2022-08-02 DIAGNOSIS — S161XXD Strain of muscle, fascia and tendon at neck level, subsequent encounter: Secondary | ICD-10-CM | POA: Diagnosis not present

## 2022-09-11 DIAGNOSIS — R194 Change in bowel habit: Secondary | ICD-10-CM | POA: Diagnosis not present

## 2022-09-11 DIAGNOSIS — K3184 Gastroparesis: Secondary | ICD-10-CM | POA: Diagnosis not present

## 2022-09-13 ENCOUNTER — Other Ambulatory Visit (HOSPITAL_COMMUNITY): Payer: Self-pay

## 2022-09-20 ENCOUNTER — Other Ambulatory Visit (HOSPITAL_COMMUNITY): Payer: Self-pay

## 2022-09-21 ENCOUNTER — Other Ambulatory Visit (HOSPITAL_COMMUNITY): Payer: Self-pay

## 2022-09-21 MED ORDER — LEVOTHYROXINE SODIUM 25 MCG PO TABS
25.0000 ug | ORAL_TABLET | Freq: Every morning | ORAL | 4 refills | Status: DC
  Filled 2022-09-21: qty 90, 90d supply, fill #0
  Filled 2023-04-11: qty 90, 90d supply, fill #1

## 2022-10-31 ENCOUNTER — Other Ambulatory Visit (HOSPITAL_COMMUNITY): Payer: Self-pay

## 2022-11-29 ENCOUNTER — Other Ambulatory Visit (HOSPITAL_COMMUNITY): Payer: Self-pay

## 2022-12-08 ENCOUNTER — Other Ambulatory Visit (HOSPITAL_COMMUNITY): Payer: Self-pay

## 2022-12-19 ENCOUNTER — Other Ambulatory Visit (HOSPITAL_COMMUNITY): Payer: Self-pay

## 2022-12-19 MED ORDER — METOCLOPRAMIDE HCL 5 MG PO TABS
5.0000 mg | ORAL_TABLET | Freq: Every evening | ORAL | 3 refills | Status: DC
  Filled 2022-12-19: qty 90, 90d supply, fill #0
  Filled 2023-03-26: qty 90, 90d supply, fill #1
  Filled 2023-06-21: qty 40, 40d supply, fill #2
  Filled 2023-06-22: qty 50, 50d supply, fill #2
  Filled 2023-09-18: qty 90, 90d supply, fill #3

## 2023-01-09 DIAGNOSIS — E890 Postprocedural hypothyroidism: Secondary | ICD-10-CM | POA: Diagnosis not present

## 2023-01-15 ENCOUNTER — Other Ambulatory Visit (HOSPITAL_COMMUNITY): Payer: Self-pay

## 2023-01-15 DIAGNOSIS — E041 Nontoxic single thyroid nodule: Secondary | ICD-10-CM | POA: Diagnosis not present

## 2023-01-15 DIAGNOSIS — E890 Postprocedural hypothyroidism: Secondary | ICD-10-CM | POA: Diagnosis not present

## 2023-01-15 MED ORDER — LEVOTHYROXINE SODIUM 25 MCG PO TABS
25.0000 ug | ORAL_TABLET | Freq: Every morning | ORAL | 5 refills | Status: DC
  Filled 2023-01-15: qty 90, 90d supply, fill #0
  Filled 2023-08-25: qty 90, 90d supply, fill #1
  Filled 2023-12-02: qty 90, 90d supply, fill #2

## 2023-02-19 ENCOUNTER — Other Ambulatory Visit (HOSPITAL_COMMUNITY): Payer: Self-pay

## 2023-02-21 ENCOUNTER — Other Ambulatory Visit (HOSPITAL_COMMUNITY): Payer: Self-pay

## 2023-03-05 ENCOUNTER — Other Ambulatory Visit (HOSPITAL_COMMUNITY): Payer: Self-pay

## 2023-03-05 MED ORDER — RABEPRAZOLE SODIUM 20 MG PO TBEC
20.0000 mg | DELAYED_RELEASE_TABLET | Freq: Two times a day (BID) | ORAL | 3 refills | Status: DC
  Filled 2023-03-05: qty 180, 90d supply, fill #0
  Filled 2023-06-06: qty 180, 90d supply, fill #1
  Filled 2023-09-01: qty 180, 90d supply, fill #2
  Filled 2023-12-02 – 2023-12-03 (×2): qty 30, 30d supply, fill #3
  Filled 2023-12-07: qty 180, 90d supply, fill #3

## 2023-03-21 ENCOUNTER — Telehealth: Payer: Self-pay | Admitting: Gastroenterology

## 2023-03-21 NOTE — Telephone Encounter (Signed)
Hi Dr. Lavon Paganini,     We received referral for patient specifically over to you for IBS and gastroparesis. Patient has history with [facility]. Patient is requesting to transfer her care due to her previous MD retiring and then care was taken over by a PA. Patient recently received a letter stating the PA she was being seen with is also leaving. Patient states she has also not been happy with care provided. Patient also states she does not want to travel back and forth anymore and would now like to stay local. Patient requesting to been seen you due to being referred and also have heard good reviews regarding gastroparesis care. Patients records are in [facility] for you to review and advise on scheduling.    Thank you.

## 2023-03-30 NOTE — Telephone Encounter (Signed)
Request received to transfer GI care from outside practice to [facility].  We appreciate the interest in our practice, however at this time due to high demand from patients without established GI providers we cannot accommodate this transfer.  Ability to accommodate future transfer requests may change over time and the patient can contact us again in 6-12 months if still interested in being seen at [facility].      °

## 2023-04-05 ENCOUNTER — Other Ambulatory Visit (HOSPITAL_COMMUNITY): Payer: Self-pay

## 2023-04-05 NOTE — Telephone Encounter (Signed)
Patient advised of transfer of care status.

## 2023-04-11 ENCOUNTER — Other Ambulatory Visit (HOSPITAL_COMMUNITY): Payer: Self-pay

## 2023-04-12 ENCOUNTER — Other Ambulatory Visit: Payer: Self-pay

## 2023-04-12 ENCOUNTER — Other Ambulatory Visit (HOSPITAL_COMMUNITY): Payer: Self-pay

## 2023-04-12 MED ORDER — ONDANSETRON 4 MG PO TBDP
4.0000 mg | ORAL_TABLET | Freq: Three times a day (TID) | ORAL | 1 refills | Status: DC | PRN
  Filled 2023-04-12: qty 60, 20d supply, fill #0
  Filled 2023-12-12: qty 60, 20d supply, fill #1

## 2023-04-13 ENCOUNTER — Other Ambulatory Visit (HOSPITAL_COMMUNITY): Payer: Self-pay

## 2023-04-13 MED ORDER — DICYCLOMINE HCL 10 MG PO CAPS
10.0000 mg | ORAL_CAPSULE | Freq: Three times a day (TID) | ORAL | 0 refills | Status: DC | PRN
  Filled 2023-04-13: qty 270, 90d supply, fill #0

## 2023-06-07 DIAGNOSIS — M5030 Other cervical disc degeneration, unspecified cervical region: Secondary | ICD-10-CM | POA: Diagnosis not present

## 2023-06-07 DIAGNOSIS — Z0000 Encounter for general adult medical examination without abnormal findings: Secondary | ICD-10-CM | POA: Diagnosis not present

## 2023-06-07 DIAGNOSIS — E890 Postprocedural hypothyroidism: Secondary | ICD-10-CM | POA: Diagnosis not present

## 2023-06-07 DIAGNOSIS — E669 Obesity, unspecified: Secondary | ICD-10-CM | POA: Diagnosis not present

## 2023-06-07 DIAGNOSIS — K219 Gastro-esophageal reflux disease without esophagitis: Secondary | ICD-10-CM | POA: Diagnosis not present

## 2023-06-07 DIAGNOSIS — E7800 Pure hypercholesterolemia, unspecified: Secondary | ICD-10-CM | POA: Diagnosis not present

## 2023-06-07 DIAGNOSIS — K582 Mixed irritable bowel syndrome: Secondary | ICD-10-CM | POA: Diagnosis not present

## 2023-06-07 DIAGNOSIS — K3184 Gastroparesis: Secondary | ICD-10-CM | POA: Diagnosis not present

## 2023-06-07 DIAGNOSIS — E559 Vitamin D deficiency, unspecified: Secondary | ICD-10-CM | POA: Diagnosis not present

## 2023-06-07 DIAGNOSIS — R110 Nausea: Secondary | ICD-10-CM | POA: Diagnosis not present

## 2023-06-07 DIAGNOSIS — F5101 Primary insomnia: Secondary | ICD-10-CM | POA: Diagnosis not present

## 2023-06-21 ENCOUNTER — Other Ambulatory Visit: Payer: Self-pay

## 2023-06-21 ENCOUNTER — Other Ambulatory Visit (HOSPITAL_COMMUNITY): Payer: Self-pay

## 2023-06-22 ENCOUNTER — Other Ambulatory Visit: Payer: Self-pay

## 2023-06-22 ENCOUNTER — Other Ambulatory Visit (HOSPITAL_COMMUNITY): Payer: Self-pay

## 2023-06-28 DIAGNOSIS — Z6831 Body mass index (BMI) 31.0-31.9, adult: Secondary | ICD-10-CM | POA: Diagnosis not present

## 2023-06-28 DIAGNOSIS — Z1272 Encounter for screening for malignant neoplasm of vagina: Secondary | ICD-10-CM | POA: Diagnosis not present

## 2023-06-28 DIAGNOSIS — Z1231 Encounter for screening mammogram for malignant neoplasm of breast: Secondary | ICD-10-CM | POA: Diagnosis not present

## 2023-06-28 DIAGNOSIS — Z01419 Encounter for gynecological examination (general) (routine) without abnormal findings: Secondary | ICD-10-CM | POA: Diagnosis not present

## 2023-06-29 ENCOUNTER — Other Ambulatory Visit (HOSPITAL_BASED_OUTPATIENT_CLINIC_OR_DEPARTMENT_OTHER): Payer: Self-pay

## 2023-06-29 MED ORDER — COMIRNATY 30 MCG/0.3ML IM SUSY
0.3000 mL | PREFILLED_SYRINGE | Freq: Once | INTRAMUSCULAR | 0 refills | Status: AC
  Filled 2023-06-29: qty 0.3, 1d supply, fill #0

## 2023-08-10 DIAGNOSIS — H52223 Regular astigmatism, bilateral: Secondary | ICD-10-CM | POA: Diagnosis not present

## 2023-08-10 DIAGNOSIS — H524 Presbyopia: Secondary | ICD-10-CM | POA: Diagnosis not present

## 2023-08-10 DIAGNOSIS — H5203 Hypermetropia, bilateral: Secondary | ICD-10-CM | POA: Diagnosis not present

## 2023-08-25 ENCOUNTER — Other Ambulatory Visit (HOSPITAL_COMMUNITY): Payer: Self-pay

## 2023-08-30 ENCOUNTER — Other Ambulatory Visit (HOSPITAL_COMMUNITY): Payer: Self-pay

## 2023-08-30 MED ORDER — DICYCLOMINE HCL 10 MG PO CAPS
10.0000 mg | ORAL_CAPSULE | Freq: Three times a day (TID) | ORAL | 3 refills | Status: DC | PRN
  Filled 2023-08-30: qty 270, 90d supply, fill #0
  Filled 2023-12-30: qty 270, 90d supply, fill #1
  Filled 2024-04-05: qty 270, 90d supply, fill #2
  Filled 2024-07-27: qty 270, 90d supply, fill #3

## 2023-09-18 ENCOUNTER — Other Ambulatory Visit (HOSPITAL_COMMUNITY): Payer: Self-pay

## 2023-10-22 IMAGING — US US THYROID
1 series · 13 of 25 positions shown · non-contrast
Comparison: 12/24/2019

CLINICAL DATA: Remote left thyroidectomy. No right thyroid nodules.
Right thyroid lower pole biopsies 01/20/2015.

EXAM:
THYROID ULTRASOUND
TECHNIQUE: Ultrasound examination of the thyroid gland and adjacent soft
tissues was performed.

[Series 1: us thyroid · 0.06mm/px · 13 of 44 slices shown]
[im 1/44]
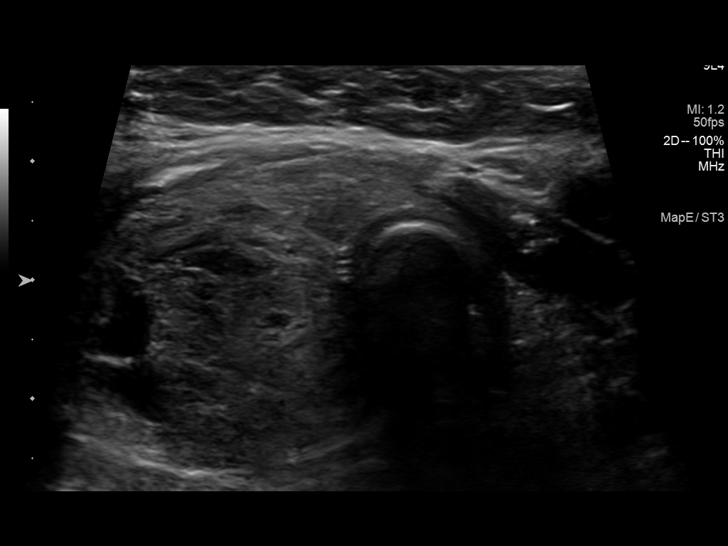
[im 4/44]
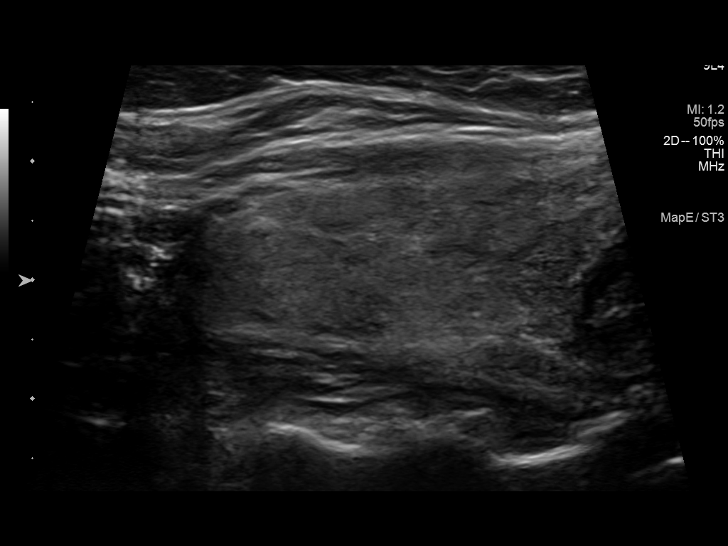
[im 8/44]
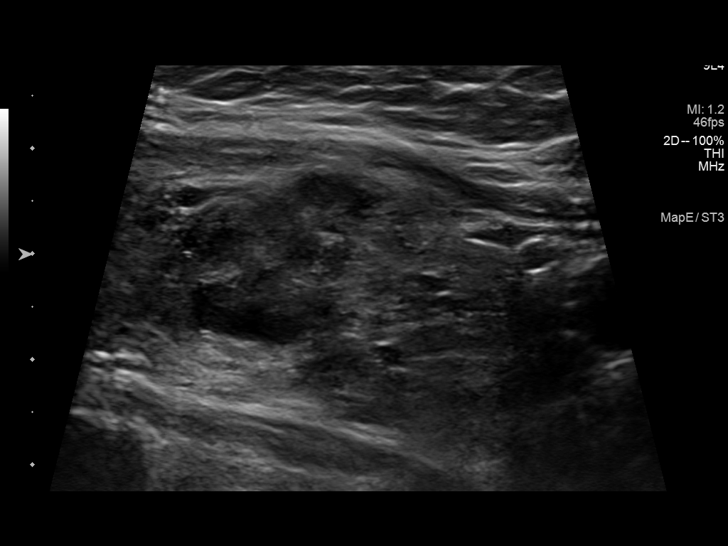
[im 11/44]
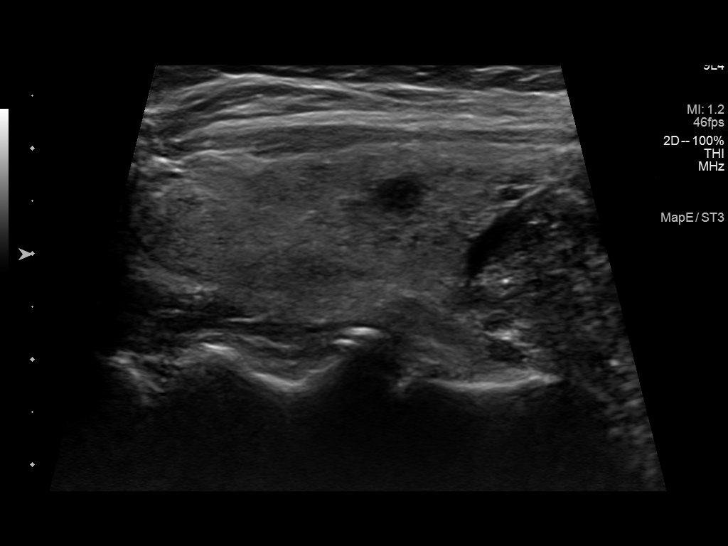
[im 15/44]
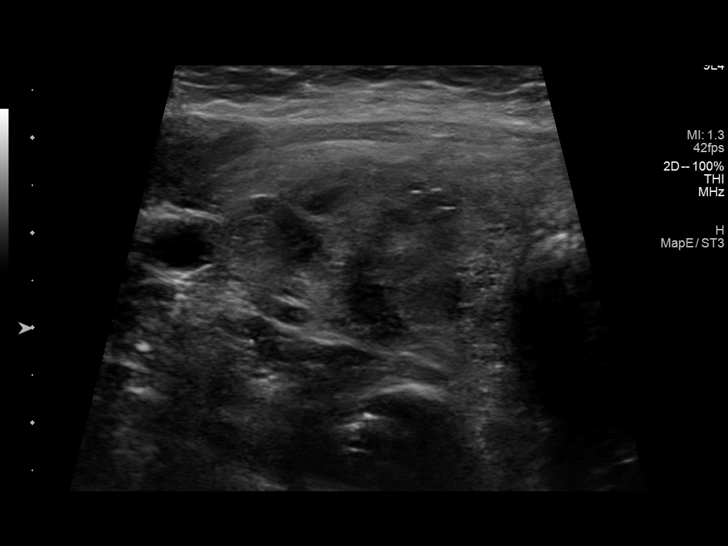
[im 18/44]
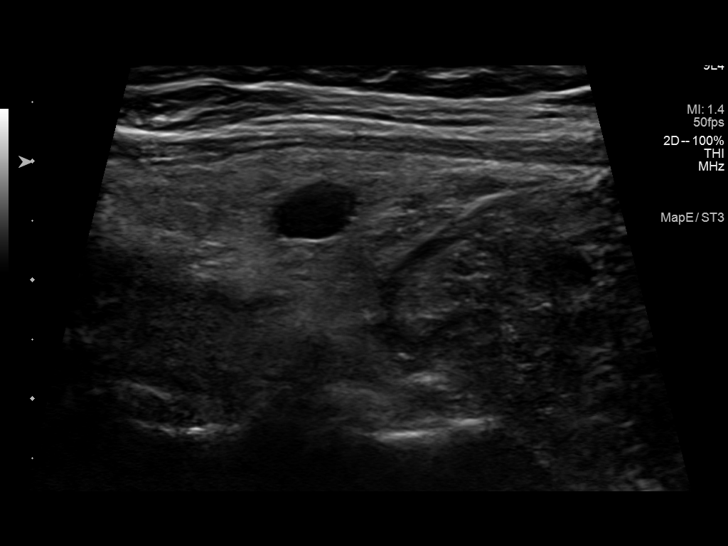
[im 22/44]
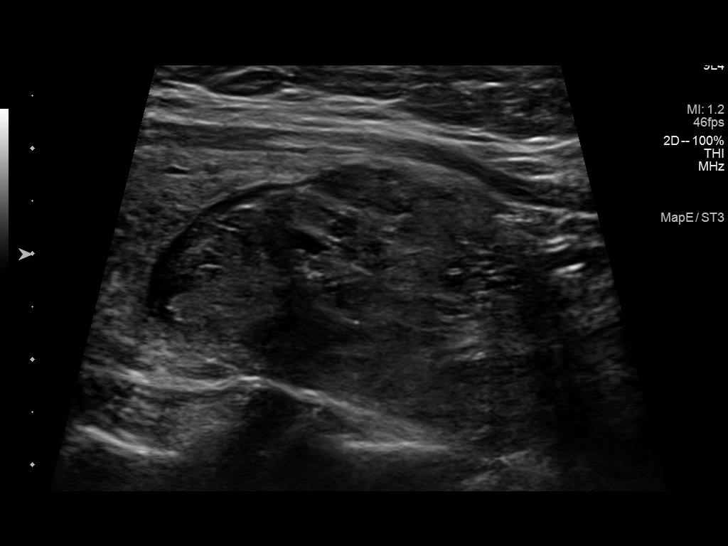
[im 26/44]
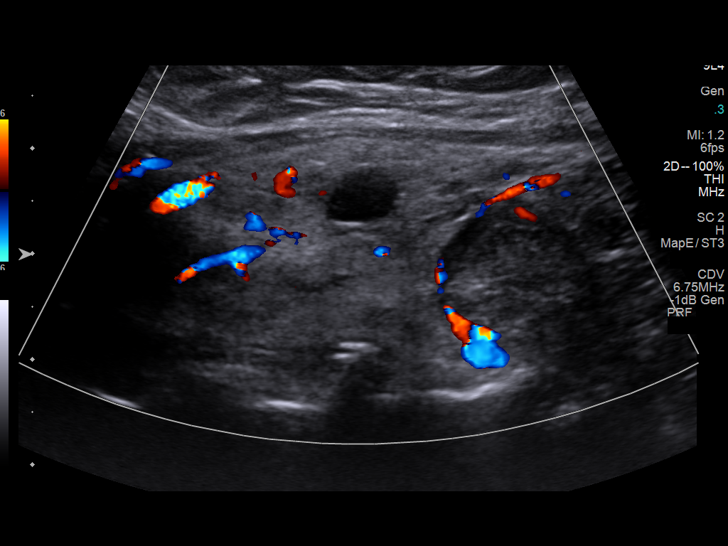
[im 29/44]
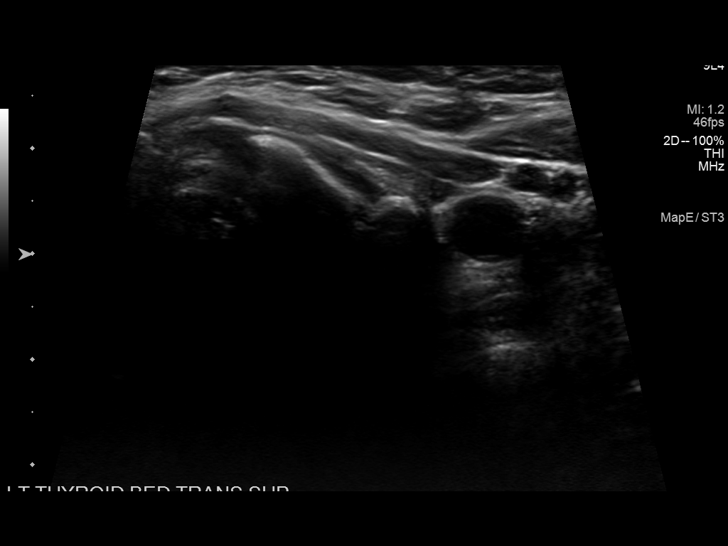
[im 33/44]
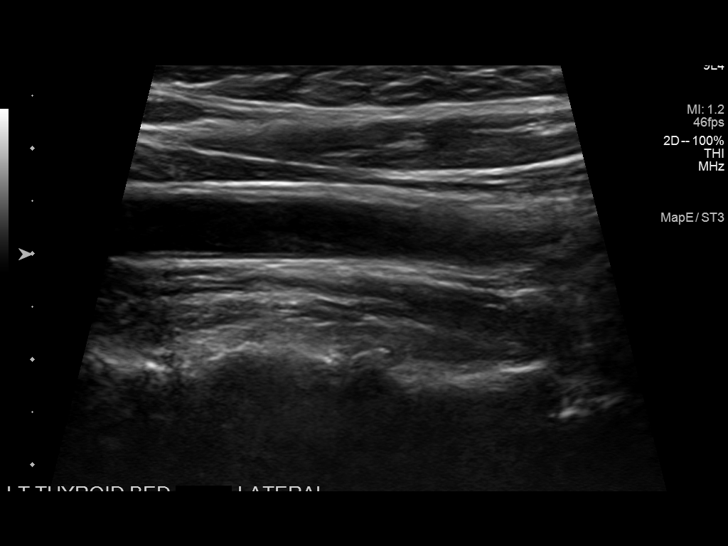
[im 36/44]
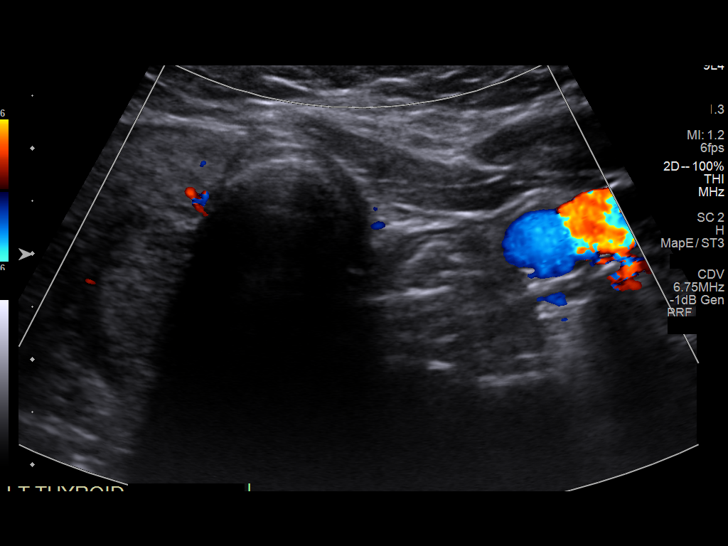
[im 40/44]
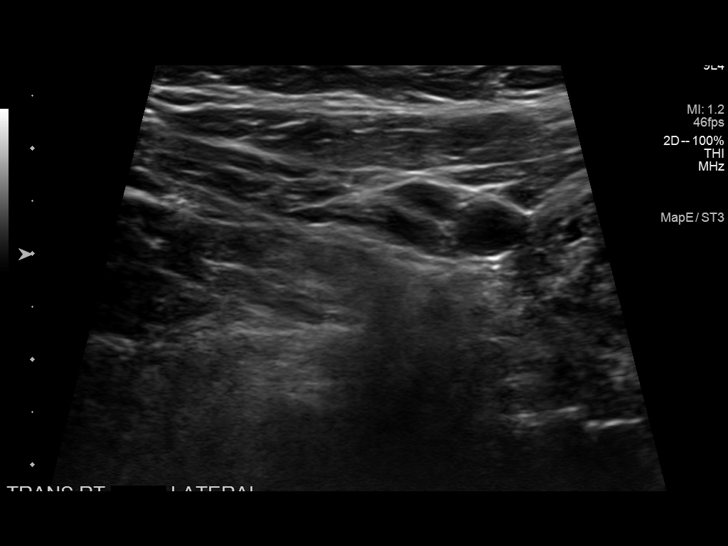
[im 44/44]
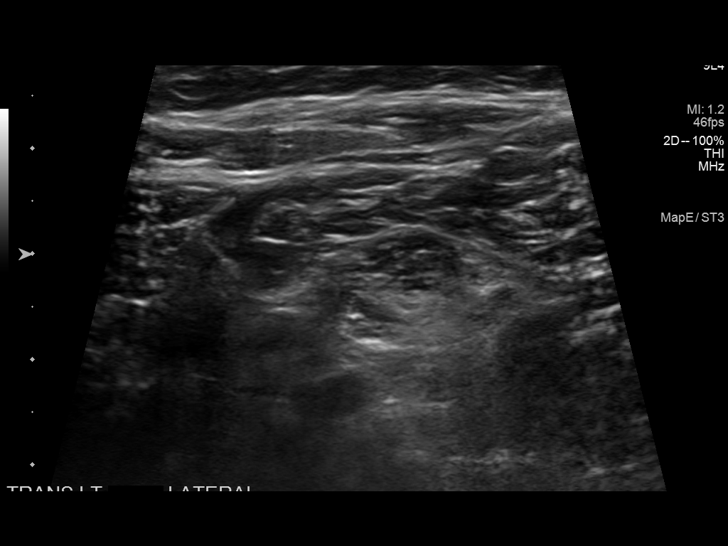

[13 of 25 positions shown; findings below may reference images not displayed]

FINDINGS: Parenchymal Echotexture: Moderately heterogenous

Isthmus: 4 mm

Right lobe: 6.5 x 2.9 x 3.3 cm

Left lobe: Surgically absent

_________________________________________________________

Estimated total number of nodules >/= 1 cm: 2

Number of spongiform nodules >/=  2 cm not described below (TR1): 0

Number of mixed cystic and solid nodules >/= 1.5 cm not described
below (TR2): 0

_________________________________________________________

Nodule 1 represents a stable 11 mm TR 1 cystic nodule in the right
thyroid superiorly.

Nodule 2 represents the previously biopsied right inferior thyroid
solid isoechoic TR 3 type nodule measuring 4.2 x 2.9 x 2.2 cm,
previously 3.6 x 3.1 x 2.2 cm. Little interval change in appearance
in characteristic by ultrasound.

Remote left thyroidectomy.  No left thyroid bed abnormality.

No new thyroid abnormality. No hypervascularity or regional
adenopathy.
IMPRESSION: Status post previous left thyroidectomy

Similar multinodular goiter findings in the remaining right thyroid
lobe.

Previously biopsied dominant right inferior thyroid TR 3 type
nodule, not significantly changed. Correlate with prior pathology.

The above is in keeping with the ACR TI-RADS recommendations - [HOSPITAL] 9701;[DATE].

## 2023-11-11 ENCOUNTER — Telehealth: Admitting: Physician Assistant

## 2023-11-11 DIAGNOSIS — N3000 Acute cystitis without hematuria: Secondary | ICD-10-CM | POA: Diagnosis not present

## 2023-11-11 MED ORDER — NITROFURANTOIN MONOHYD MACRO 100 MG PO CAPS
100.0000 mg | ORAL_CAPSULE | Freq: Two times a day (BID) | ORAL | 0 refills | Status: AC
Start: 2023-11-11 — End: 2023-11-16

## 2023-11-11 NOTE — Progress Notes (Signed)
 Virtual Visit Consent   Karen [name], you are scheduled for a virtual visit with a [facility] provider today. Just as with appointments in the office, your consent must be obtained to participate. Your consent will be active for this visit and any virtual visit you may have with one of our providers in the next 365 days. If you have a MyChart account, a copy of this consent can be sent to you electronically.  As this is a virtual visit, video technology does not allow for your provider to perform a traditional examination. This may limit your provider's ability to fully assess your condition. If your provider identifies any concerns that need to be evaluated in person or the need to arrange testing (such as labs, EKG, etc.), we will make arrangements to do so. Although advances in technology are sophisticated, we cannot ensure that it will always work on either your end or our end. If the connection with a video visit is poor, the visit may have to be switched to a telephone visit. With either a video or telephone visit, we are not always able to ensure that we have a secure connection.  By engaging in this virtual visit, you consent to the provision of healthcare and authorize for your insurance to be billed (if applicable) for the services provided during this visit. Depending on your insurance coverage, you may receive a charge related to this service.  I need to obtain your verbal consent now. Are you willing to proceed with your visit today? Karen [name] has provided verbal consent on 11/11/2023 for a virtual visit (video or telephone). Roney Jaffe, PA-C  Date: 11/11/2023 3:12 PM   Virtual Visit via Video Note   I, Karen [name], connected with  Karen [name]  ([id number], 1971/07/28) on 11/11/23 at  3:15 PM EDT by a video-enabled telemedicine application and verified that I am speaking with the correct person using two identifiers.  Location: Patient: Virtual Visit Location  Patient: Home Provider: Virtual Visit Location Provider: Home Office   I discussed the limitations of evaluation and management by telemedicine and the availability of in person appointments. The patient expressed understanding and agreed to proceed.    History of Present Illness: Karen [name] is a 53 y.o. who identifies as a female who was assigned female at birth, presents with symptoms suggestive of a urinary tract infection (UTI) that began on Friday. She reports cloudy urine, increased urinary frequency, and lower pelvic floor pressure. She describes the sensation of needing to urinate but only being able to void a small amount. She denies dysuria, hematuria, abdominal pain, and lower back pain. She also denies fever and chills, but notes chronic nausea due to gastroparesis. She has not taken any antibiotics in the last thirty days.   Problems:  Patient Active Problem List   Diagnosis Date Noted   Family history of breast cancer 05/12/2019   Vitamin D deficiency 02/18/2018   Milia of eyelid of right eye 02/18/2018   Esophageal dysmotilities 02/18/2018   IBS (irritable bowel syndrome) 02/18/2018   Multinodular goiter 02/18/2018   Leukocytosis 12/18/2014   Chronic allergic rhinitis 03/09/2010   Gastroparesis 10/18/2009   Gastroesophageal reflux disease without esophagitis 08/06/2007    Allergies:  Allergies  Allergen Reactions   Onion Other (See Comments)   Hops Oil Rash   Hydrocortisone Rash    Water blisters when cream applied   Medications:  Current Outpatient Medications:    nitrofurantoin, macrocrystal-monohydrate, (MACROBID)  100 MG capsule, Take 1 capsule (100 mg total) by mouth 2 (two) times daily for 5 days., Disp: 10 capsule, Rfl: 0   aluminum hydroxide-magnesium carbonate (GAVISCON) 95-358 MG/15ML SUSP, Take by mouth., Disp: , Rfl:    cholestyramine (QUESTRAN) 4 G packet, Take 4 g by mouth 2 (two) times daily., Disp: , Rfl:    COVID-19 mRNA Vac-TriS, Pfizer,  (PFIZER-BIONT COVID-19 VAC-TRIS) SUSP injection, Inject into the muscle., Disp: 0.3 mL, Rfl: 0   dicyclomine (BENTYL) 10 MG capsule, Take 10 mg by mouth 2 (two) times daily as needed for spasms., Disp: , Rfl:    dicyclomine (BENTYL) 10 MG capsule, Take 1 capsule (10 mg total) by mouth 3 (three) times daily as needed., Disp: 270 capsule, Rfl: 1   dicyclomine (BENTYL) 10 MG capsule, Take 1 capsule (10 mg total) by mouth 3 (three) times daily as needed., Disp: 270 capsule, Rfl: 1   dicyclomine (BENTYL) 10 MG capsule, Take 1 capsule (10 mg total) by mouth 3 (three) times daily as needed., Disp: 270 capsule, Rfl: 0   dicyclomine (BENTYL) 10 MG capsule, Take 1 capsule (10 mg total) by mouth 3 (three) times daily as needed (abdominal pain)., Disp: 270 capsule, Rfl: 3   dronabinol (MARINOL) 2.5 MG capsule, Take 1 capsule (2.5 mg total) by mouth 2 (two) times daily before a meal., Disp: 60 capsule, Rfl: 0   dronabinol (MARINOL) 2.5 MG capsule, Take 1 capsule (2.5 mg total) by mouth 2 (two) times daily before meals, Disp: 60 capsule, Rfl: 3   dronabinol (MARINOL) 5 MG capsule, Take by mouth., Disp: , Rfl:    famotidine (PEPCID) 40 MG tablet, Take 1 tablet (40 mg total) by mouth at bedtime., Disp: 90 tablet, Rfl: 3   levothyroxine (LEVOTHROID) 25 MCG tablet, Take 25 mcg by mouth daily., Disp: , Rfl:    levothyroxine (SYNTHROID) 25 MCG tablet, TAKE 1 TABLET BY MOUTH EVERY MORNING ON AN EMPTY STOMACH, Disp: 90 tablet, Rfl: 4   levothyroxine (SYNTHROID) 25 MCG tablet, Take 1 tablet (25 mcg total) by mouth in the morning on an emtpy stomach, Disp: 90 tablet, Rfl: 4   levothyroxine (SYNTHROID) 25 MCG tablet, Take 1 tablet (25 mcg total) by mouth in the morning on an empty stomach, Disp: 90 tablet, Rfl: 4   levothyroxine (SYNTHROID) 25 MCG tablet, take 1 tablet in the morning on an empty stomach  Once a day, Disp: 90 tablet, Rfl: 5   methocarbamol (ROBAXIN) 750 MG tablet, Take 1 tablet (750 mg total) by mouth 2 (two)  times daily., Disp: 14 tablet, Rfl: 0   methocarbamol (ROBAXIN) 750 MG tablet, Take 1 tablet (750 mg total) by mouth 2 (two) times daily., Disp: 14 tablet, Rfl: 0   metoCLOPramide (REGLAN) 5 MG tablet, Take 5 mg by mouth 3 (three) times daily before meals., Disp: , Rfl:    metoCLOPramide (REGLAN) 5 MG tablet, Take 1 tablet (5 mg total) by mouth nightly, Disp: 90 tablet, Rfl: 3   Multiple Vitamin (MULTI-VITAMINS) TABS, Take by mouth., Disp: , Rfl:    ondansetron (ZOFRAN-ODT) 4 MG disintegrating tablet, Dissolve 1 tablet (4 mg total) by mouth every 8 (eight) hours as needed for nausea, Disp: 180 tablet, Rfl: 1   ondansetron (ZOFRAN-ODT) 4 MG disintegrating tablet, Dissolve 1 tablet (4 mg total) on the tongue every 8 (eight) hours as needed for nausea, Disp: 180 tablet, Rfl: 1   ondansetron (ZOFRAN-ODT) 4 MG disintegrating tablet, Dissolve 1 tablet (4 mg total) on tongue every  8 (eight) hours as needed for nausea., Disp: 60 tablet, Rfl: 1   Ondansetron HCl (ZOFRAN PO), Take by mouth. AS NEEDED FOR NAUSEA AND/OR VOMITING, Disp: , Rfl:    Probiotic Product (PROBIOTIC PO), Take by mouth., Disp: , Rfl:    promethazine (PHENERGAN) 25 MG tablet, TAKE 1 TABLET BY MOUTH EVERY 6 HOURS AS NEEDED FOR NAUSEA. ALTERNATE USE WITH ZOFRAN AS NEEDED., Disp: 90 tablet, Rfl: 3   promethazine (PHENERGAN) 25 MG tablet, Take 1 tablet (25 mg total) by mouth every 6 (six) hours as needed nausea. Alternate with zofran as needed, Disp: 90 tablet, Rfl: 3   pyridostigmine (MESTINON) 60 MG tablet, Take 1 tablet by mouth 3 (three) times daily., Disp: , Rfl:    pyridostigmine (MESTINON) 60 MG tablet, TAKE 1 TABLET (60 MG TOTAL) BY MOUTH 3 TIMES DAILY., Disp: 90 tablet, Rfl: 3   RABEprazole (ACIPHEX) 20 MG tablet, Take by mouth., Disp: , Rfl:    RABEprazole (ACIPHEX) 20 MG tablet, Take 1 tablet (20 mg total) by mouth 2 (two) times daily., Disp: 180 tablet, Rfl: 3   sulfamethoxazole-trimethoprim (BACTRIM DS) 800-160 MG tablet, Take 1  tablet by mouth every 12 (twelve) hours for 7 days, Disp: 14 tablet, Rfl: 0   tretinoin (RETIN-A) 0.025 % cream, APPLY TO ACNE AT BEDTIME AS DIRECTED, Disp: , Rfl: 2  Observations/Objective: Patient is well-developed, well-nourished in no acute distress.  Resting comfortably  at home.  Head is normocephalic, atraumatic.  No labored breathing.  Speech is clear and coherent with logical content.  Patient is alert and oriented at baseline.    Assessment and Plan: 1. Acute cystitis without hematuria (Primary) - nitrofurantoin, macrocrystal-monohydrate, (MACROBID) 100 MG capsule; Take 1 capsule (100 mg total) by mouth 2 (two) times daily for 5 days.  Dispense: 10 capsule; Refill: 0   Symptoms suggestive of a UTI include cloudy urine, increased frequency, and lower pelvic pressure since Friday. No dysuria, hematuria, abdominal or back pain, fever, or chills reported.  - Prescribe Macrobid (nitrofurantoin) for broad-spectrum coverage of common UTI bacteria. - Advise to complete the full course of antibiotics. - Encourage increased fluid intake and rest. - Instruct to follow up if symptoms do not improve.   Follow Up Instructions: I discussed the assessment and treatment plan with the patient. The patient was provided an opportunity to ask questions and all were answered. The patient agreed with the plan and demonstrated an understanding of the instructions.  A copy of instructions were sent to the patient via MyChart unless otherwise noted below.     The patient was advised to call back or seek an in-person evaluation if the symptoms worsen or if the condition fails to improve as anticipated.    Karen Knudsen Mayers, PA-C

## 2023-11-11 NOTE — Patient Instructions (Signed)
 Karen [name], thank you for joining Roney Jaffe, PA-C for today's virtual visit.  While this provider is not your primary care provider (PCP), if your PCP is located in our provider database this encounter information will be shared with them immediately following your visit.   A [facility] MyChart account gives you access to today's visit and all your visits, tests, and labs performed at [facility] " click here if you don't have a [facility] MyChart account or go to [email or web address]  Consent: (Patient) Karen [name] provided verbal consent for this virtual visit at the beginning of the encounter.  Current Medications:  Current Outpatient Medications:    nitrofurantoin, macrocrystal-monohydrate, (MACROBID) 100 MG capsule, Take 1 capsule (100 mg total) by mouth 2 (two) times daily for 5 days., Disp: 10 capsule, Rfl: 0   aluminum hydroxide-magnesium carbonate (GAVISCON) 95-358 MG/15ML SUSP, Take by mouth., Disp: , Rfl:    cholestyramine (QUESTRAN) 4 G packet, Take 4 g by mouth 2 (two) times daily., Disp: , Rfl:    COVID-19 mRNA Vac-TriS, Pfizer, (PFIZER-BIONT COVID-19 VAC-TRIS) SUSP injection, Inject into the muscle., Disp: 0.3 mL, Rfl: 0   dicyclomine (BENTYL) 10 MG capsule, Take 10 mg by mouth 2 (two) times daily as needed for spasms., Disp: , Rfl:    dicyclomine (BENTYL) 10 MG capsule, Take 1 capsule (10 mg total) by mouth 3 (three) times daily as needed., Disp: 270 capsule, Rfl: 1   dicyclomine (BENTYL) 10 MG capsule, Take 1 capsule (10 mg total) by mouth 3 (three) times daily as needed., Disp: 270 capsule, Rfl: 1   dicyclomine (BENTYL) 10 MG capsule, Take 1 capsule (10 mg total) by mouth 3 (three) times daily as needed., Disp: 270 capsule, Rfl: 0   dicyclomine (BENTYL) 10 MG capsule, Take 1 capsule (10 mg total) by mouth 3 (three) times daily as needed (abdominal pain)., Disp: 270 capsule, Rfl: 3   dronabinol (MARINOL) 2.5 MG capsule, Take 1 capsule (2.5 mg  total) by mouth 2 (two) times daily before a meal., Disp: 60 capsule, Rfl: 0   dronabinol (MARINOL) 2.5 MG capsule, Take 1 capsule (2.5 mg total) by mouth 2 (two) times daily before meals, Disp: 60 capsule, Rfl: 3   dronabinol (MARINOL) 5 MG capsule, Take by mouth., Disp: , Rfl:    famotidine (PEPCID) 40 MG tablet, Take 1 tablet (40 mg total) by mouth at bedtime., Disp: 90 tablet, Rfl: 3   levothyroxine (LEVOTHROID) 25 MCG tablet, Take 25 mcg by mouth daily., Disp: , Rfl:    levothyroxine (SYNTHROID) 25 MCG tablet, TAKE 1 TABLET BY MOUTH EVERY MORNING ON AN EMPTY STOMACH, Disp: 90 tablet, Rfl: 4   levothyroxine (SYNTHROID) 25 MCG tablet, Take 1 tablet (25 mcg total) by mouth in the morning on an emtpy stomach, Disp: 90 tablet, Rfl: 4   levothyroxine (SYNTHROID) 25 MCG tablet, Take 1 tablet (25 mcg total) by mouth in the morning on an empty stomach, Disp: 90 tablet, Rfl: 4   levothyroxine (SYNTHROID) 25 MCG tablet, take 1 tablet in the morning on an empty stomach  Once a day, Disp: 90 tablet, Rfl: 5   methocarbamol (ROBAXIN) 750 MG tablet, Take 1 tablet (750 mg total) by mouth 2 (two) times daily., Disp: 14 tablet, Rfl: 0   methocarbamol (ROBAXIN) 750 MG tablet, Take 1 tablet (750 mg total) by mouth 2 (two) times daily., Disp: 14 tablet, Rfl: 0   metoCLOPramide (REGLAN) 5 MG tablet, Take 5 mg by mouth 3 (three)  times daily before meals., Disp: , Rfl:    metoCLOPramide (REGLAN) 5 MG tablet, Take 1 tablet (5 mg total) by mouth nightly, Disp: 90 tablet, Rfl: 3   Multiple Vitamin (MULTI-VITAMINS) TABS, Take by mouth., Disp: , Rfl:    ondansetron (ZOFRAN-ODT) 4 MG disintegrating tablet, Dissolve 1 tablet (4 mg total) by mouth every 8 (eight) hours as needed for nausea, Disp: 180 tablet, Rfl: 1   ondansetron (ZOFRAN-ODT) 4 MG disintegrating tablet, Dissolve 1 tablet (4 mg total) on the tongue every 8 (eight) hours as needed for nausea, Disp: 180 tablet, Rfl: 1   ondansetron (ZOFRAN-ODT) 4 MG disintegrating  tablet, Dissolve 1 tablet (4 mg total) on tongue every 8 (eight) hours as needed for nausea., Disp: 60 tablet, Rfl: 1   Ondansetron HCl (ZOFRAN PO), Take by mouth. AS NEEDED FOR NAUSEA AND/OR VOMITING, Disp: , Rfl:    Probiotic Product (PROBIOTIC PO), Take by mouth., Disp: , Rfl:    promethazine (PHENERGAN) 25 MG tablet, TAKE 1 TABLET BY MOUTH EVERY 6 HOURS AS NEEDED FOR NAUSEA. ALTERNATE USE WITH ZOFRAN AS NEEDED., Disp: 90 tablet, Rfl: 3   promethazine (PHENERGAN) 25 MG tablet, Take 1 tablet (25 mg total) by mouth every 6 (six) hours as needed nausea. Alternate with zofran as needed, Disp: 90 tablet, Rfl: 3   pyridostigmine (MESTINON) 60 MG tablet, Take 1 tablet by mouth 3 (three) times daily., Disp: , Rfl:    pyridostigmine (MESTINON) 60 MG tablet, TAKE 1 TABLET (60 MG TOTAL) BY MOUTH 3 TIMES DAILY., Disp: 90 tablet, Rfl: 3   RABEprazole (ACIPHEX) 20 MG tablet, Take by mouth., Disp: , Rfl:    RABEprazole (ACIPHEX) 20 MG tablet, Take 1 tablet (20 mg total) by mouth 2 (two) times daily., Disp: 180 tablet, Rfl: 3   sulfamethoxazole-trimethoprim (BACTRIM DS) 800-160 MG tablet, Take 1 tablet by mouth every 12 (twelve) hours for 7 days, Disp: 14 tablet, Rfl: 0   tretinoin (RETIN-A) 0.025 % cream, APPLY TO ACNE AT BEDTIME AS DIRECTED, Disp: , Rfl: 2   Medications ordered in this encounter:  Meds ordered this encounter  Medications   nitrofurantoin, macrocrystal-monohydrate, (MACROBID) 100 MG capsule    Sig: Take 1 capsule (100 mg total) by mouth 2 (two) times daily for 5 days.    Dispense:  10 capsule    Refill:  0    Supervising Provider:   Merrilee Jansky [[id number]]     *If you need refills on other medications prior to your next appointment, please contact your pharmacy*  Follow-Up: Call back or seek an in-person evaluation if the symptoms worsen or if the condition fails to improve as anticipated.  [facility] [phone]  Other Instructions Urinary Tract Infection,  Female A urinary tract infection (UTI) is an infection in your urinary tract. The urinary tract is made up of organs that make, store, and get rid of pee (urine) in your body. These organs include: The kidneys. The ureters. The bladder. The urethra. What are the causes? Most UTIs are caused by germs called bacteria. They may be in or near your genitals. These germs grow and cause swelling in your urinary tract. What increases the risk? You're more likely to get a UTI if: You're a female. The urethra is shorter in females than in males. You have a soft tube called a catheter that drains your pee. You can't control when you pee or poop. You have trouble peeing because of: A kidney stone. A urinary blockage.  A nerve condition that affects your bladder. Not getting enough to drink. You're sexually active. You use a birth control inside your vagina, like spermicide. You're pregnant. You have low levels of the hormone estrogen in your body. You're an older adult. You're also more likely to get a UTI if you have other health problems. These may include: Diabetes. A weak immune system. Your immune system is your body's defense system. Sickle cell disease. Injury of the spine. What are the signs or symptoms? Symptoms may include: Needing to pee right away. Peeing small amounts often. Pain or burning when you pee. Blood in your pee. Pee that smells bad or odd. Pain in your belly or lower back. You may also: Feel confused. This may be the first symptom in older adults. Vomit. Not feel hungry. Feel tired or easily annoyed. Have a fever or chills. How is this diagnosed? A UTI is diagnosed based on your medical history and an exam. You may also have other tests. These may include: Pee tests. Blood tests. Tests for sexually transmitted infections (STIs). If you've had more than one UTI, you may need to have imaging studies done to find out why you keep getting them. How is this  treated? A UTI can be treated by: Taking antibiotics or other medicines. Drinking enough fluid to keep your pee pale yellow. In rare cases, a UTI can cause a very bad condition called sepsis. Sepsis may be treated in the hospital. Follow these instructions at home: Medicines Take your medicines only as told by your health care provider. If you were given antibiotics, take them as told by your provider. Do not stop taking them even if you start to feel better. General instructions Make sure you: Pee often and fully. Do not hold your pee for a long time. Wipe from front to back after you pee or poop. Use each tissue only once when you wipe. Pee after you have sex. Do not douche or use sprays or powders in your genital area. Contact a health care provider if: Your symptoms don't get better after 1-2 days of taking antibiotics. Your symptoms go away and then come back. You have a fever or chills. You vomit or feel like you may vomit. Get help right away if: You have very bad pain in your back or lower belly. You faint. This information is not intended to replace advice given to you by your health care provider. Make sure you discuss any questions you have with your health care provider. Document Revised: 03/08/2023 Document Reviewed: 11/03/2022 Elsevier Patient Education  2024 Elsevier Inc.   If you have been instructed to have an in-person evaluation today at a local Urgent Care facility, please use the link below. It will take you to a list of all of our available [facility], including address, phone number and hours of operation. Please do not delay care.  [facility]  If you or a family member do not have a primary care provider, use the link below to schedule a visit and establish care. When you choose a [facility] primary care physician or advanced practice provider, you gain a long-term partner in health. Find a Primary Care Provider  Learn more about  [facility]'s in-office and virtual care options:  - Get Care Now

## 2023-11-15 ENCOUNTER — Other Ambulatory Visit (HOSPITAL_COMMUNITY): Payer: Self-pay

## 2023-11-15 ENCOUNTER — Encounter (HOSPITAL_COMMUNITY): Payer: Self-pay

## 2023-11-15 DIAGNOSIS — K5909 Other constipation: Secondary | ICD-10-CM | POA: Diagnosis not present

## 2023-11-15 DIAGNOSIS — K3184 Gastroparesis: Secondary | ICD-10-CM | POA: Diagnosis not present

## 2023-11-15 MED ORDER — PRUCALOPRIDE SUCCINATE 2 MG PO TABS
2.0000 mg | ORAL_TABLET | Freq: Every day | ORAL | 11 refills | Status: DC
  Filled 2023-11-15: qty 30, 30d supply, fill #0
  Filled 2023-12-12: qty 30, 30d supply, fill #1
  Filled 2024-01-13: qty 30, 30d supply, fill #2
  Filled 2024-03-02: qty 30, 30d supply, fill #3
  Filled 2024-05-26: qty 30, 30d supply, fill #4
  Filled 2024-06-24: qty 30, 30d supply, fill #5
  Filled 2024-07-27: qty 30, 30d supply, fill #6

## 2023-12-03 ENCOUNTER — Other Ambulatory Visit: Payer: Self-pay

## 2023-12-03 ENCOUNTER — Other Ambulatory Visit (HOSPITAL_COMMUNITY): Payer: Self-pay

## 2023-12-05 ENCOUNTER — Other Ambulatory Visit (HOSPITAL_COMMUNITY): Payer: Self-pay

## 2023-12-07 ENCOUNTER — Other Ambulatory Visit (HOSPITAL_COMMUNITY): Payer: Self-pay

## 2023-12-12 ENCOUNTER — Other Ambulatory Visit (HOSPITAL_COMMUNITY): Payer: Self-pay

## 2023-12-13 ENCOUNTER — Other Ambulatory Visit (HOSPITAL_COMMUNITY): Payer: Self-pay

## 2023-12-13 ENCOUNTER — Other Ambulatory Visit: Payer: Self-pay

## 2023-12-14 ENCOUNTER — Other Ambulatory Visit (HOSPITAL_COMMUNITY): Payer: Self-pay

## 2023-12-14 MED ORDER — METOCLOPRAMIDE HCL 5 MG PO TABS
5.0000 mg | ORAL_TABLET | Freq: Every day | ORAL | 3 refills | Status: AC
  Filled 2023-12-14: qty 90, 90d supply, fill #0
  Filled 2024-03-18: qty 90, 90d supply, fill #1
  Filled 2024-06-14: qty 90, 90d supply, fill #2
  Filled 2024-09-11: qty 90, 90d supply, fill #3

## 2024-01-03 ENCOUNTER — Other Ambulatory Visit: Payer: Self-pay | Admitting: Endocrinology

## 2024-01-03 DIAGNOSIS — E049 Nontoxic goiter, unspecified: Secondary | ICD-10-CM

## 2024-01-04 ENCOUNTER — Ambulatory Visit
Admission: RE | Admit: 2024-01-04 | Discharge: 2024-01-04 | Disposition: A | Discharge status: Home / Self Care | Source: Ambulatory Visit | Attending: Endocrinology | Admitting: Endocrinology

## 2024-01-04 DIAGNOSIS — E049 Nontoxic goiter, unspecified: Secondary | ICD-10-CM

## 2024-01-04 DIAGNOSIS — Z9889 Other specified postprocedural states: Secondary | ICD-10-CM | POA: Diagnosis not present

## 2024-01-04 DIAGNOSIS — E041 Nontoxic single thyroid nodule: Secondary | ICD-10-CM | POA: Diagnosis not present

## 2024-01-15 DIAGNOSIS — E890 Postprocedural hypothyroidism: Secondary | ICD-10-CM | POA: Diagnosis not present

## 2024-01-22 ENCOUNTER — Other Ambulatory Visit (HOSPITAL_COMMUNITY): Payer: Self-pay

## 2024-01-22 DIAGNOSIS — E890 Postprocedural hypothyroidism: Secondary | ICD-10-CM | POA: Diagnosis not present

## 2024-01-22 DIAGNOSIS — E041 Nontoxic single thyroid nodule: Secondary | ICD-10-CM | POA: Diagnosis not present

## 2024-01-22 MED ORDER — LEVOTHYROXINE SODIUM 25 MCG PO TABS
25.0000 ug | ORAL_TABLET | Freq: Every morning | ORAL | 5 refills | Status: DC
  Filled 2024-03-02: qty 90, 90d supply, fill #0
  Filled 2024-06-02: qty 90, 90d supply, fill #1

## 2024-02-13 ENCOUNTER — Other Ambulatory Visit (HOSPITAL_COMMUNITY): Payer: Self-pay

## 2024-02-13 MED ORDER — DRONABINOL 2.5 MG PO CAPS
2.5000 mg | ORAL_CAPSULE | Freq: Two times a day (BID) | ORAL | 5 refills | Status: DC
  Filled 2024-02-13: qty 60, 30d supply, fill #0

## 2024-02-14 ENCOUNTER — Other Ambulatory Visit (HOSPITAL_COMMUNITY): Payer: Self-pay

## 2024-03-02 ENCOUNTER — Other Ambulatory Visit (HOSPITAL_COMMUNITY): Payer: Self-pay

## 2024-03-03 ENCOUNTER — Other Ambulatory Visit (HOSPITAL_COMMUNITY): Payer: Self-pay

## 2024-03-03 ENCOUNTER — Other Ambulatory Visit: Payer: Self-pay

## 2024-03-03 MED ORDER — RABEPRAZOLE SODIUM 20 MG PO TBEC
20.0000 mg | DELAYED_RELEASE_TABLET | Freq: Two times a day (BID) | ORAL | 3 refills | Status: AC
  Filled 2024-03-03: qty 180, 90d supply, fill #0
  Filled 2024-06-02: qty 180, 90d supply, fill #1
  Filled 2024-09-01: qty 180, 90d supply, fill #2

## 2024-03-12 ENCOUNTER — Other Ambulatory Visit (HOSPITAL_COMMUNITY): Payer: Self-pay

## 2024-03-12 MED ORDER — AMOXICILLIN 500 MG PO CAPS
500.0000 mg | ORAL_CAPSULE | Freq: Three times a day (TID) | ORAL | 0 refills | Status: DC
  Filled 2024-03-12: qty 21, 7d supply, fill #0

## 2024-04-05 ENCOUNTER — Other Ambulatory Visit (HOSPITAL_COMMUNITY): Payer: Self-pay

## 2024-04-07 ENCOUNTER — Other Ambulatory Visit: Payer: Self-pay

## 2024-04-08 ENCOUNTER — Other Ambulatory Visit (HOSPITAL_COMMUNITY): Payer: Self-pay

## 2024-04-08 MED ORDER — ONDANSETRON 4 MG PO TBDP
4.0000 mg | ORAL_TABLET | Freq: Three times a day (TID) | ORAL | 1 refills | Status: DC | PRN
  Filled 2024-04-08: qty 60, 20d supply, fill #0
  Filled 2024-05-26: qty 60, 20d supply, fill #1

## 2024-05-16 ENCOUNTER — Other Ambulatory Visit (HOSPITAL_BASED_OUTPATIENT_CLINIC_OR_DEPARTMENT_OTHER): Payer: Self-pay

## 2024-05-16 MED ORDER — COMIRNATY 30 MCG/0.3ML IM SUSY
0.3000 mL | PREFILLED_SYRINGE | Freq: Once | INTRAMUSCULAR | 0 refills | Status: AC
  Filled 2024-05-16: qty 0.3, 1d supply, fill #0

## 2024-06-03 ENCOUNTER — Other Ambulatory Visit (HOSPITAL_COMMUNITY): Payer: Self-pay

## 2024-06-09 DIAGNOSIS — E890 Postprocedural hypothyroidism: Secondary | ICD-10-CM | POA: Diagnosis not present

## 2024-06-09 DIAGNOSIS — K582 Mixed irritable bowel syndrome: Secondary | ICD-10-CM | POA: Diagnosis not present

## 2024-06-09 DIAGNOSIS — E7800 Pure hypercholesterolemia, unspecified: Secondary | ICD-10-CM | POA: Diagnosis not present

## 2024-06-09 DIAGNOSIS — K3184 Gastroparesis: Secondary | ICD-10-CM | POA: Diagnosis not present

## 2024-06-09 DIAGNOSIS — F5101 Primary insomnia: Secondary | ICD-10-CM | POA: Diagnosis not present

## 2024-06-09 DIAGNOSIS — R110 Nausea: Secondary | ICD-10-CM | POA: Diagnosis not present

## 2024-06-09 DIAGNOSIS — Z9049 Acquired absence of other specified parts of digestive tract: Secondary | ICD-10-CM | POA: Diagnosis not present

## 2024-06-09 DIAGNOSIS — E669 Obesity, unspecified: Secondary | ICD-10-CM | POA: Diagnosis not present

## 2024-06-09 DIAGNOSIS — E559 Vitamin D deficiency, unspecified: Secondary | ICD-10-CM | POA: Diagnosis not present

## 2024-06-09 DIAGNOSIS — K219 Gastro-esophageal reflux disease without esophagitis: Secondary | ICD-10-CM | POA: Diagnosis not present

## 2024-06-09 DIAGNOSIS — Z0000 Encounter for general adult medical examination without abnormal findings: Secondary | ICD-10-CM | POA: Diagnosis not present

## 2024-06-23 ENCOUNTER — Other Ambulatory Visit (HOSPITAL_COMMUNITY): Payer: Self-pay

## 2024-06-24 ENCOUNTER — Other Ambulatory Visit (HOSPITAL_COMMUNITY): Payer: Self-pay

## 2024-06-24 DIAGNOSIS — R110 Nausea: Secondary | ICD-10-CM | POA: Diagnosis not present

## 2024-06-24 DIAGNOSIS — K5909 Other constipation: Secondary | ICD-10-CM | POA: Diagnosis not present

## 2024-06-24 DIAGNOSIS — D696 Thrombocytopenia, unspecified: Secondary | ICD-10-CM | POA: Diagnosis not present

## 2024-06-24 DIAGNOSIS — K3184 Gastroparesis: Secondary | ICD-10-CM | POA: Diagnosis not present

## 2024-06-24 MED ORDER — PROCHLORPERAZINE MALEATE 5 MG PO TABS
5.0000 mg | ORAL_TABLET | Freq: Two times a day (BID) | ORAL | 5 refills | Status: AC | PRN
  Filled 2024-06-24: qty 60, 30d supply, fill #0
  Filled 2024-07-27: qty 60, 30d supply, fill #1
  Filled 2024-09-01: qty 60, 30d supply, fill #2

## 2024-06-27 ENCOUNTER — Encounter (HOSPITAL_COMMUNITY): Payer: Self-pay | Admitting: Obstetrics and Gynecology

## 2024-06-27 ENCOUNTER — Other Ambulatory Visit (HOSPITAL_COMMUNITY): Payer: Self-pay | Admitting: Obstetrics and Gynecology

## 2024-06-27 DIAGNOSIS — Z9189 Other specified personal risk factors, not elsewhere classified: Secondary | ICD-10-CM

## 2024-07-08 DIAGNOSIS — D696 Thrombocytopenia, unspecified: Secondary | ICD-10-CM | POA: Diagnosis not present

## 2024-07-14 DIAGNOSIS — Z6831 Body mass index (BMI) 31.0-31.9, adult: Secondary | ICD-10-CM | POA: Diagnosis not present

## 2024-07-14 DIAGNOSIS — Z1272 Encounter for screening for malignant neoplasm of vagina: Secondary | ICD-10-CM | POA: Diagnosis not present

## 2024-07-14 DIAGNOSIS — Z01419 Encounter for gynecological examination (general) (routine) without abnormal findings: Secondary | ICD-10-CM | POA: Diagnosis not present

## 2024-07-14 DIAGNOSIS — Z1231 Encounter for screening mammogram for malignant neoplasm of breast: Secondary | ICD-10-CM | POA: Diagnosis not present

## 2024-07-18 ENCOUNTER — Ambulatory Visit (HOSPITAL_COMMUNITY)
Admission: RE | Admit: 2024-07-18 | Discharge: 2024-07-18 | Disposition: A | Source: Ambulatory Visit | Attending: Obstetrics and Gynecology | Admitting: Obstetrics and Gynecology

## 2024-07-18 DIAGNOSIS — Z9189 Other specified personal risk factors, not elsewhere classified: Secondary | ICD-10-CM

## 2024-07-18 DIAGNOSIS — Z1239 Encounter for other screening for malignant neoplasm of breast: Secondary | ICD-10-CM | POA: Diagnosis not present

## 2024-07-18 DIAGNOSIS — R921 Mammographic calcification found on diagnostic imaging of breast: Secondary | ICD-10-CM | POA: Diagnosis not present

## 2024-07-18 MED ORDER — GADOBUTROL 1 MMOL/ML IV SOLN
8.0000 mL | Freq: Once | INTRAVENOUS | Status: AC | PRN
  Administered 2024-07-18: 8 mL via INTRAVENOUS

## 2024-07-21 ENCOUNTER — Other Ambulatory Visit: Payer: Self-pay | Admitting: Obstetrics and Gynecology

## 2024-07-21 DIAGNOSIS — R928 Other abnormal and inconclusive findings on diagnostic imaging of breast: Secondary | ICD-10-CM

## 2024-07-24 DIAGNOSIS — H52223 Regular astigmatism, bilateral: Secondary | ICD-10-CM | POA: Diagnosis not present

## 2024-07-24 DIAGNOSIS — H5203 Hypermetropia, bilateral: Secondary | ICD-10-CM | POA: Diagnosis not present

## 2024-07-24 DIAGNOSIS — H524 Presbyopia: Secondary | ICD-10-CM | POA: Diagnosis not present

## 2024-07-27 NOTE — Progress Notes (Unsigned)
 [name] [phone]   [phone]  CONSULT NOTE  REFERRING PHYSICIAN: Dr. Vernon   REASON FOR CONSULTATION:  Thromboctopenia  HPI Karen [name] is a 53 y.o. female with a past medical history significant for GERD, irritable bowel syndrome, esophageal dysmotility, vitamin D  deficiency, cholecystectomy, degenerative disc disease, and thyroid  dysfunction is referred to the clinic for thrombocytopenia  She saw her PCP on 06/09/2024 for wellness visit.  She had lab work performed on 06/09/2024 and her platelet count was 95,000.  They recommended 4-week follow-up CBC.   She was seen at her PCPs office on 07/08/2024.  Her CBC showed white blood cell count of 5.3, normal hemoglobin at 12.9, normal platelet count 83,000.  Overall, she is feeling *** today. Her energy is ***. She denies shortness of breath, lightheadedness, syncope, or palpatiations. She denies bleeding complications except for ***.   To her knowledge, the first time she was told she had low PLT in ***. I have blood work from 2013-2021.   She iron infusions or blood transfusions before. She is not on a blood thinner ***.   Denies any NSAID use    Denies any chronic kidney disease.  Denies any dietary practices/restrictions such as being a vegan or vegetarian.  She estimates she eats red meat, particularly beef, about *** times per week.  Denies any history of bariatric surgery.  Denies any frequent phlebotomies or blood donation.  Denies any pica.  Denies history of stomach ulcers.   Vitamin deficiencies ***.   The patient cannot recall starting any new medications around the time when the thrombocytopenia started.     She denies any personal history of autoimmune disorders. Denies any recent fever, chills, night sweats, or lymphadenopathy. Denies any recent or frequent infections. Denies any history of hepatitis or HIV.   ***ever need blood or platelet tranfusion She denies any  bleeding complications from surgery. She denies history of cirrhosis or splenomegaly.    HPI  Past Medical History:  Diagnosis Date   Chronic nausea    Family history of breast cancer 05/12/2019   Mother passed age 67 with recurrent breast cancer, metastatic.  Pt had gene testing 04/2019   Gastroparesis    Dr. Missie   GERD (gastroesophageal reflux disease)    History of multinodular goiter 02/18/2018   Had partial thyroidectomy 1996   Hypothyroidism    IBS (irritable bowel syndrome)    Dr. Missie   PONV (postoperative nausea and vomiting)    Seasonal allergies    SVD (spontaneous vaginal delivery)    x 1   Thyroid  nodule     Past Surgical History:  Procedure Laterality Date   CHOLECYSTECTOMY     EYE SURGERY     lasik  bilateral   LAPAROSCOPIC ASSISTED VAGINAL HYSTERECTOMY  09/12/2012   Procedure: LAPAROSCOPIC ASSISTED VAGINAL HYSTERECTOMY;  Surgeon: Alm JAYSON Cook, MD;  Location: [facility];  Service: Gynecology;  Laterality: N/A;   THYROIDECTOMY, PARTIAL     WISDOM TOOTH EXTRACTION      Family History  Problem Relation Age of Onset   Healthy Daughter    Healthy Daughter    Breast cancer Mother    Diabetes Father    Cancer Maternal Grandmother    Heart attack Paternal Grandfather     Social History Social History[1]  Allergies[2]  Current Outpatient Medications  Medication Sig Dispense Refill   aluminum hydroxide-magnesium carbonate (GAVISCON) 95-358 MG/15ML SUSP Take by mouth.     amoxicillin  (  AMOXIL ) 500 MG capsule Take 1 capsule (500 mg total) by mouth 3 (three) times daily. 21 capsule 0   cholestyramine (QUESTRAN) 4 G packet Take 4 g by mouth 2 (two) times daily.     COVID-19 mRNA Vac-TriS, Pfizer, (PFIZER-BIONT COVID-19 VAC-TRIS) SUSP injection Inject into the muscle. 0.3 mL 0   dicyclomine  (BENTYL ) 10 MG capsule Take 10 mg by mouth 2 (two) times daily as needed for spasms.     dicyclomine  (BENTYL ) 10 MG capsule Take 1 capsule (10 mg total) by mouth 3 (three)  times daily as needed. 270 capsule 1   dicyclomine  (BENTYL ) 10 MG capsule Take 1 capsule (10 mg total) by mouth 3 (three) times daily as needed. 270 capsule 1   dicyclomine  (BENTYL ) 10 MG capsule Take 1 capsule (10 mg total) by mouth 3 (three) times daily as needed. 270 capsule 0   dicyclomine  (BENTYL ) 10 MG capsule Take 1 capsule (10 mg total) by mouth 3 (three) times daily as needed (abdominal pain). 270 capsule 3   dronabinol  (MARINOL ) 2.5 MG capsule Take 1 capsule (2.5 mg total) by mouth 2 (two) times daily before a meal. 60 capsule 0   dronabinol  (MARINOL ) 2.5 MG capsule Take 1 capsule (2.5 mg total) by mouth 2 (two) times daily before meals 60 capsule 3   dronabinol  (MARINOL ) 5 MG capsule Take by mouth.     famotidine  (PEPCID ) 40 MG tablet Take 1 tablet (40 mg total) by mouth at bedtime. 90 tablet 3   levothyroxine  (LEVOTHROID) 25 MCG tablet Take 25 mcg by mouth daily.     levothyroxine  (SYNTHROID ) 25 MCG tablet TAKE 1 TABLET BY MOUTH EVERY MORNING ON AN EMPTY STOMACH 90 tablet 4   levothyroxine  (SYNTHROID ) 25 MCG tablet Take 1 tablet (25 mcg total) by mouth in the morning on an emtpy stomach 90 tablet 4   levothyroxine  (SYNTHROID ) 25 MCG tablet Take 1 tablet (25 mcg total) by mouth in the morning on an empty stomach 90 tablet 4   levothyroxine  (SYNTHROID ) 25 MCG tablet Take 1 tablet (25 mcg total) by mouth in the morning on an empty stomach. 90 tablet 5   methocarbamol  (ROBAXIN ) 750 MG tablet Take 1 tablet (750 mg total) by mouth 2 (two) times daily. 14 tablet 0   methocarbamol  (ROBAXIN ) 750 MG tablet Take 1 tablet (750 mg total) by mouth 2 (two) times daily. 14 tablet 0   metoCLOPramide  (REGLAN ) 5 MG tablet Take 5 mg by mouth 3 (three) times daily before meals.     metoCLOPramide  (REGLAN ) 5 MG tablet Take 1 tablet (5 mg total) by mouth nightly. 90 tablet 3   Multiple Vitamin (MULTI-VITAMINS) TABS Take by mouth.     ondansetron  (ZOFRAN -ODT) 4 MG disintegrating tablet Dissolve 1 tablet (4 mg  total) by mouth every 8 (eight) hours as needed for nausea 180 tablet 1   ondansetron  (ZOFRAN -ODT) 4 MG disintegrating tablet Dissolve 1 tablet (4 mg total) on the tongue every 8 (eight) hours as needed for nausea 180 tablet 1   ondansetron  (ZOFRAN -ODT) 4 MG disintegrating tablet Dissolve 1 tablet (4 mg total) on tongue every 8 (eight) hours as needed for nausea. 60 tablet 1   Ondansetron  HCl (ZOFRAN  PO) Take by mouth. AS NEEDED FOR NAUSEA AND/OR VOMITING     Probiotic Product (PROBIOTIC PO) Take by mouth.     prochlorperazine  (COMPAZINE ) 5 MG tablet Take 1 tablet (5 mg total) by mouth 2 (two) times a day as needed for nausea or vomiting. 60  tablet 5   promethazine  (PHENERGAN ) 25 MG tablet TAKE 1 TABLET BY MOUTH EVERY 6 HOURS AS NEEDED FOR NAUSEA. ALTERNATE USE WITH ZOFRAN  AS NEEDED. 90 tablet 3   promethazine  (PHENERGAN ) 25 MG tablet Take 1 tablet (25 mg total) by mouth every 6 (six) hours as needed nausea. Alternate with zofran  as needed 90 tablet 3   Prucalopride Succinate  2 MG TABS Take 1 tablet (2 mg total) by mouth daily. 30 tablet 11   pyridostigmine (MESTINON) 60 MG tablet Take 1 tablet by mouth 3 (three) times daily.     pyridostigmine (MESTINON) 60 MG tablet TAKE 1 TABLET (60 MG TOTAL) BY MOUTH 3 TIMES DAILY. 90 tablet 3   RABEprazole  (ACIPHEX ) 20 MG tablet Take by mouth.     RABEprazole  (ACIPHEX ) 20 MG tablet Take 1 tablet (20 mg total) by mouth 2 (two) times daily. 180 tablet 3   sulfamethoxazole -trimethoprim  (BACTRIM  DS) 800-160 MG tablet Take 1 tablet by mouth every 12 (twelve) hours for 7 days 14 tablet 0   tretinoin (RETIN-A) 0.025 % cream APPLY TO ACNE AT BEDTIME AS DIRECTED  2   No current facility-administered medications for this visit.    REVIEW OF SYSTEMS:   Review of Systems  Constitutional: Negative for appetite change, chills, fatigue, fever and unexpected weight change.  HENT:   Negative for mouth sores, nosebleeds, sore throat and trouble swallowing.   Eyes: Negative  for eye problems and icterus.  Respiratory: Negative for cough, hemoptysis, shortness of breath and wheezing.   Cardiovascular: Negative for chest pain and leg swelling.  Gastrointestinal: Negative for abdominal pain, constipation, diarrhea, nausea and vomiting.  Genitourinary: Negative for bladder incontinence, difficulty urinating, dysuria, frequency and hematuria.   Musculoskeletal: Negative for back pain, gait problem, neck pain and neck stiffness.  Skin: Negative for itching and rash.  Neurological: Negative for dizziness, extremity weakness, gait problem, headaches, light-headedness and seizures.  Hematological: Negative for adenopathy. Does not bruise/bleed easily.  Psychiatric/Behavioral: Negative for confusion, depression and sleep disturbance. The patient is not nervous/anxious.     PHYSICAL EXAMINATION:  Last menstrual period 08/08/2012.  ECOG PERFORMANCE STATUS: {CHL ONC ECOG [id number]  Physical Exam  Constitutional: Oriented to person, place, and time and well-developed, well-nourished, and in no distress. No distress.  HENT:  Head: Normocephalic and atraumatic.  Mouth/Throat: Oropharynx is clear and moist. No oropharyngeal exudate.  Eyes: Conjunctivae are normal. Right eye exhibits no discharge. Left eye exhibits no discharge. No scleral icterus.  Neck: Normal range of motion. Neck supple.  Cardiovascular: Normal rate, regular rhythm, normal heart sounds and intact distal pulses.   Pulmonary/Chest: Effort normal and breath sounds normal. No respiratory distress. No wheezes. No rales.  Abdominal: Soft. Bowel sounds are normal. Exhibits no distension and no mass. There is no tenderness.  Musculoskeletal: Normal range of motion. Exhibits no edema.  Lymphadenopathy:    No cervical adenopathy.  Neurological: Alert and oriented to person, place, and time. Exhibits normal muscle tone. Gait normal. Coordination normal.  Skin: Skin is warm and dry. No rash noted. Not  diaphoretic. No erythema. No pallor.  Psychiatric: Mood, memory and judgment normal.  Vitals reviewed.  LABORATORY DATA: Lab Results  Component Value Date   WBC 7.4 06/09/2020   HGB 12.8 06/09/2020   HCT 39.5 06/09/2020   MCV 83.0 06/09/2020   PLT 182 06/09/2020      Chemistry      Component Value Date/Time   NA 139 06/09/2020 1205   K 3.9 06/09/2020 1205  CL 107 06/09/2020 1205   CO2 25 06/09/2020 1205   BUN 9 06/09/2020 1205   CREATININE 0.97 06/09/2020 1205      Component Value Date/Time   CALCIUM 8.8 06/09/2020 1205   ALKPHOS 68 05/12/2019 1323   AST 41 (H) 06/09/2020 1205   ALT 22 06/09/2020 1205   BILITOT 0.8 06/09/2020 1205       RADIOGRAPHIC STUDIES: MR BREAST BILATERAL W WO CONTRAST INC CAD Result Date: 07/21/2024 CLINICAL DATA:  High risk screening. Reported calculated lifetime risk for developing breast carcinoma is 39.4%. Patient had a screening mammogram performed on 07/14/2024. Calcifications in the right breast were noted recommended for further evaluation. Diagnostic follow-up mammography does not appear to have been performed yet. EXAM: BILATERAL BREAST MRI WITH AND WITHOUT CONTRAST TECHNIQUE: Multiplanar, multisequence MR images of both breasts were obtained prior to and following the intravenous administration of 8 ml of Gadavist  Three-dimensional MR images were rendered by post-processing of the original MR data on an independent workstation. The three-dimensional MR images were interpreted, and findings are reported in the following complete MRI report for this study. Three dimensional images were evaluated at the independent interpreting workstation using the DynaCAD thin client. COMPARISON:  Prior exams including prior breast MRIs, most recent dated 06/23/2019. CT of the abdomen and pelvis from 06/03/2010. FINDINGS: Breast composition: b. Scattered fibroglandular tissue. Background parenchymal enhancement: Mild Right breast: No mass or abnormal enhancement.  Left breast: No mass or abnormal enhancement. Lymph nodes: No abnormal appearing lymph nodes. Ancillary findings: Spleen appears prominent, and may be enlarged, but is incompletely imaged. IMPRESSION: 1. No MRI evidence of malignancy of either breast. 2. Possible splenomegaly. Consider follow-up imaging with ultrasound or contrast enhanced CT of the abdomen. RECOMMENDATION: 1. Recommendations are based on the patient's prior screening mammogram, which was a BI-RADS 0 exam, recommending follow-up diagnostic right breast mammography for calcifications. BI-RADS CATEGORY  1: Negative. Electronically Signed   By: Alm Parkins M.D.   On: 07/21/2024 11:38    ASSESSMENT: This is a very pleasant 53 year old female referred to the clinic for thrombocytopenia.  The patient has CBC, CMP, B12, folic acid , LDH, ANA, rheumatoid factor, HIV, and hepatitis testing.  The patient was seen with Dr. Sherrod today.  Labs were reviewed.  We will wait for her lab results before determining the next steps.  We will see her back for follow-up visit in ***.   The patient voices understanding of current disease status and treatment options and is in agreement with the current care plan.  All questions were answered. The patient knows to call the clinic with any problems, questions or concerns. We can certainly see the patient much sooner if necessary.  Thank you so much for allowing me to participate in the care of Eleanor VEAR Battiest. I will continue to follow up the patient with you and assist in her care.  I spent {CHL ONC TIME VISIT - DTPQU:[id number]} counseling the patient face to face. The total time spent in the appointment was {CHL ONC TIME VISIT - DTPQU:[id number]}.  Disclaimer: This note was dictated with voice recognition software. Similar sounding words can inadvertently be transcribed and may not be corrected upon review.   Teren Zurcher L Bruno Leach July 27, 2024, 9:35 AM       [1]  Social  History Tobacco Use   Smoking status: Never   Smokeless tobacco: Never   Tobacco comments:    never smoked  Vaping Use   Vaping status: Never Used  Substance  Use Topics   Alcohol use: No   Drug use: No  [2]  Allergies Allergen Reactions   Onion Other (See Comments)   Hops Oil Rash   Hydrocortisone Rash    Water blisters when cream applied

## 2024-07-28 ENCOUNTER — Other Ambulatory Visit: Payer: Self-pay | Admitting: Physician Assistant

## 2024-07-28 ENCOUNTER — Other Ambulatory Visit (HOSPITAL_COMMUNITY): Payer: Self-pay

## 2024-07-28 ENCOUNTER — Inpatient Hospital Stay

## 2024-07-28 ENCOUNTER — Inpatient Hospital Stay: Attending: Physician Assistant | Admitting: Physician Assistant

## 2024-07-28 VITALS — BP 128/87 | HR 81 | Temp 97.4°F | Resp 15 | Wt 176.6 lb

## 2024-07-28 DIAGNOSIS — D696 Thrombocytopenia, unspecified: Secondary | ICD-10-CM

## 2024-07-28 DIAGNOSIS — Z803 Family history of malignant neoplasm of breast: Secondary | ICD-10-CM | POA: Diagnosis not present

## 2024-07-28 DIAGNOSIS — R5383 Other fatigue: Secondary | ICD-10-CM | POA: Insufficient documentation

## 2024-07-28 DIAGNOSIS — R161 Splenomegaly, not elsewhere classified: Secondary | ICD-10-CM | POA: Diagnosis not present

## 2024-07-28 LAB — CBC WITH DIFFERENTIAL (CANCER CENTER ONLY)
Abs Immature Granulocytes: 0.01 K/uL (ref 0.00–0.07)
Basophils Absolute: 0 K/uL (ref 0.0–0.1)
Basophils Relative: 1 %
Eosinophils Absolute: 0.1 K/uL (ref 0.0–0.5)
Eosinophils Relative: 2 %
HCT: 39.8 % (ref 36.0–46.0)
Hemoglobin: 13.4 g/dL (ref 12.0–15.0)
Immature Granulocytes: 0 %
Lymphocytes Relative: 35 %
Lymphs Abs: 1.9 K/uL (ref 0.7–4.0)
MCH: 27.7 pg (ref 26.0–34.0)
MCHC: 33.7 g/dL (ref 30.0–36.0)
MCV: 82.4 fL (ref 80.0–100.0)
Monocytes Absolute: 0.5 K/uL (ref 0.1–1.0)
Monocytes Relative: 8 %
Neutro Abs: 2.9 K/uL (ref 1.7–7.7)
Neutrophils Relative %: 54 %
Platelet Count: 94 K/uL — ABNORMAL LOW (ref 150–400)
RBC: 4.83 MIL/uL (ref 3.87–5.11)
RDW: 14.4 % (ref 11.5–15.5)
Smear Review: NORMAL
WBC Count: 5.3 K/uL (ref 4.0–10.5)
nRBC: 0 % (ref 0.0–0.2)

## 2024-07-28 LAB — HEPATITIS PANEL, ACUTE
HCV Ab: NONREACTIVE
Hep A IgM: NONREACTIVE
Hep B C IgM: NONREACTIVE
Hepatitis B Surface Ag: NONREACTIVE

## 2024-07-28 LAB — CMP (CANCER CENTER ONLY)
ALT: 16 U/L (ref 0–44)
AST: 40 U/L (ref 15–41)
Albumin: 4.1 g/dL (ref 3.5–5.0)
Alkaline Phosphatase: 98 U/L (ref 38–126)
Anion gap: 9 (ref 5–15)
BUN: 8 mg/dL (ref 6–20)
CO2: 25 mmol/L (ref 22–32)
Calcium: 9.5 mg/dL (ref 8.9–10.3)
Chloride: 106 mmol/L (ref 98–111)
Creatinine: 1.03 mg/dL — ABNORMAL HIGH (ref 0.44–1.00)
GFR, Estimated: >60 mL/min (ref >60)
Glucose, Bld: 98 mg/dL (ref 70–99)
Potassium: 4.2 mmol/L (ref 3.5–5.1)
Sodium: 140 mmol/L (ref 135–145)
Total Bilirubin: 1.1 mg/dL (ref 0.0–1.2)
Total Protein: 8 g/dL (ref 6.5–8.1)

## 2024-07-28 LAB — VITAMIN B12: Vitamin B-12: 637 pg/mL (ref 180–914)

## 2024-07-28 LAB — HIV ANTIBODY (ROUTINE TESTING W REFLEX): HIV Screen 4th Generation wRfx: NONREACTIVE

## 2024-07-28 LAB — LACTATE DEHYDROGENASE: LDH: 136 U/L (ref 105–235)

## 2024-07-28 LAB — FOLATE: Folate: 12.6 ng/mL (ref >5.9)

## 2024-07-29 ENCOUNTER — Other Ambulatory Visit: Payer: Self-pay

## 2024-07-29 ENCOUNTER — Other Ambulatory Visit (HOSPITAL_COMMUNITY): Payer: Self-pay

## 2024-07-29 ENCOUNTER — Telehealth: Payer: Self-pay

## 2024-07-29 DIAGNOSIS — D696 Thrombocytopenia, unspecified: Secondary | ICD-10-CM

## 2024-07-29 LAB — ANTINUCLEAR ANTIBODIES, IFA: ANA Ab, IFA: NEGATIVE

## 2024-07-29 LAB — RHEUMATOID FACTOR: Rheumatoid fact SerPl-aCnc: 14.4 [IU]/mL — ABNORMAL HIGH (ref <14.0)

## 2024-07-29 NOTE — Telephone Encounter (Signed)
 Spoke with patient regarding scheduled ultrasound of the abdomen. Patient requested that the US  abdomen order be sent to [facility] due to lower cost at that facility. New order placed for US  Abdomen at GI.  Contacted Peggy in [employer], and the previously scheduled US  abdomen at the [facility] location was canceled. Patient was informed that [facility] should contact her within a few days to schedule the exam; advised to call their office if she does not hear from them. Patient voiced understanding.

## 2024-07-31 ENCOUNTER — Inpatient Hospital Stay: Admission: RE | Admit: 2024-07-31

## 2024-07-31 ENCOUNTER — Other Ambulatory Visit (HOSPITAL_COMMUNITY): Payer: Self-pay

## 2024-07-31 ENCOUNTER — Other Ambulatory Visit: Payer: Self-pay | Admitting: Obstetrics and Gynecology

## 2024-07-31 DIAGNOSIS — R928 Other abnormal and inconclusive findings on diagnostic imaging of breast: Secondary | ICD-10-CM | POA: Diagnosis not present

## 2024-07-31 DIAGNOSIS — R921 Mammographic calcification found on diagnostic imaging of breast: Secondary | ICD-10-CM

## 2024-07-31 MED ORDER — DRONABINOL 2.5 MG PO CAPS
2.5000 mg | ORAL_CAPSULE | Freq: Two times a day (BID) | ORAL | 5 refills | Status: AC
  Filled 2024-07-31: qty 60, 30d supply, fill #0
  Filled 2024-09-01: qty 60, 30d supply, fill #1

## 2024-08-01 ENCOUNTER — Other Ambulatory Visit (HOSPITAL_COMMUNITY): Payer: Self-pay

## 2024-08-04 ENCOUNTER — Ambulatory Visit (HOSPITAL_BASED_OUTPATIENT_CLINIC_OR_DEPARTMENT_OTHER)

## 2024-08-13 ENCOUNTER — Ambulatory Visit
Admission: RE | Admit: 2024-08-13 | Discharge: 2024-08-13 | Disposition: A | Discharge status: Home / Self Care | Source: Ambulatory Visit | Attending: Obstetrics and Gynecology | Admitting: Obstetrics and Gynecology

## 2024-08-13 DIAGNOSIS — R921 Mammographic calcification found on diagnostic imaging of breast: Secondary | ICD-10-CM

## 2024-08-13 HISTORY — PX: BREAST BIOPSY: SHX20

## 2024-08-15 ENCOUNTER — Ambulatory Visit
Admission: RE | Admit: 2024-08-15 | Discharge: 2024-08-15 | Disposition: A | Discharge status: Home / Self Care | Source: Ambulatory Visit | Attending: Physician Assistant | Admitting: Physician Assistant

## 2024-08-15 DIAGNOSIS — D696 Thrombocytopenia, unspecified: Secondary | ICD-10-CM

## 2024-08-15 LAB — SURGICAL PATHOLOGY

## 2024-08-22 NOTE — Progress Notes (Unsigned)
 [facility] OFFICE PROGRESS NOTE  Pahwani, Velna SAUNDERS, MD 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 female returns to the clinic today for a follow-up visit.  The patient was last seen in the clinic on 07/28/2024.  At that time she was referred for thrombocytopenia evaluation.  Her ANA, HIV, folic acid , hepatitis, and B12 were normal.  Her rheumatoid factor was slightly elevated but as previously mentioned her ANA is negative.  She does not have any joint complaints at this time.  She had a liver ultrasound which showed chronic liver morphology without focal hepatic lesion and an indeterminant 3.8 x 1.6 x 1.2 cm splenic lesion.  She also has splenomegaly.  The patient sees gastroenterologist Dr. Gwynneth over at [facility].  She sees them every 6 months.  He does not drink alcohol.  She is unable to increase her physical activity due to her chronic issues with her gastroparesis.  He also reports that she barely eats does not her gastroparesis and has early satiety.  The patient also takes multiple prescription medications and Chinese herbal supplements due to gastroparesis which she is unable to discontinue.  Denies any abnormal bleeding or bruising.  She experiences a lifelong tendency to bruise easily.  She previously reported variable energy levels and intermittent fatigue.  Denies fever, chills, night sweats, or recurrent infections.  She is here today for evaluation and repeat blood work and next steps in her care.   MEDICAL HISTORY: Past Medical History:  Diagnosis Date   Chronic nausea    Family history of breast cancer 05/12/2019   Mother passed age 16 with recurrent breast cancer, metastatic.  Pt had gene testing 04/2019   Gastroparesis    Dr. Missie   GERD (gastroesophageal reflux disease)     History of multinodular goiter 02/18/2018   Had partial thyroidectomy 1996   Hypothyroidism    IBS (irritable bowel syndrome)    Dr. Missie   PONV (postoperative nausea and vomiting)    Seasonal allergies    SVD (spontaneous vaginal delivery)    x 1   Thyroid  nodule     ALLERGIES:  is allergic to onion, hops oil, and hydrocortisone.  MEDICATIONS:  Current Outpatient Medications  Medication Sig Dispense Refill   aluminum hydroxide-magnesium carbonate (GAVISCON) 95-358 MG/15ML SUSP Take by mouth. (Patient taking differently: Take 15 mLs by mouth 4 (four) times daily - after meals and at bedtime.)     Cholecalciferol 100 MCG (4000 UT) CAPS Take 4,000 capsules by mouth daily.     Coenzyme Q10 100 MG capsule Take 100 mg by mouth daily.     dicyclomine  (BENTYL ) 10 MG capsule Take 1 capsule (10 mg total) by mouth 3 (three) times daily as needed (abdominal pain). 270 capsule 3   dronabinol  (MARINOL ) 2.5 MG capsule Take 1 capsule (2.5 mg total) by mouth 2 (two) times daily. 60 capsule 5   famotidine  (PEPCID ) 40 MG tablet Take 1 tablet (40 mg total) by mouth at bedtime. 90 tablet 3   levothyroxine  (LEVOTHROID) 25 MCG tablet Take 25 mcg by mouth daily.     metoCLOPramide  (REGLAN ) 5 MG tablet Take 1 tablet (5 mg total) by mouth nightly. 90 tablet 3   Multiple Vitamin (MULTI-VITAMINS) TABS Take by mouth.     ondansetron  (ZOFRAN -ODT) 4 MG disintegrating tablet Dissolve 1 tablet (4  mg total) by mouth every 8 (eight) hours as needed for nausea 180 tablet 1   Probiotic Product (PROBIOTIC PO) Take by mouth.     prochlorperazine  (COMPAZINE ) 5 MG tablet Take 1 tablet (5 mg total) by mouth 2 (two) times a day as needed for nausea or vomiting. 60 tablet 5   promethazine  (PHENERGAN ) 25 MG tablet Take 1 tablet (25 mg total) by mouth every 6 (six) hours as needed nausea. Alternate with zofran  as needed 90 tablet 3   Prucalopride Succinate  (MOTEGRITY ) 2 MG TABS Take 2 mg by mouth daily.     RABEprazole   (ACIPHEX ) 20 MG tablet Take 1 tablet (20 mg total) by mouth 2 (two) times daily. 180 tablet 3   No current facility-administered medications for this visit.    SURGICAL HISTORY:  Past Surgical History:  Procedure Laterality Date   BREAST BIOPSY Right 08/13/2024   MM RT BREAST BX W LOC DEV 1ST LESION IMAGE BX SPEC STEREO GUIDE 08/13/2024 [facility]GR[facility]   CHOLECYSTECTOMY     EYE SURGERY     lasik  bilateral   LAPAROSCOPIC ASSISTED VAGINAL HYSTERECTOMY  09/12/2012   Procedure: LAPAROSCOPIC ASSISTED VAGINAL HYSTERECTOMY;  Surgeon: Alm JAYSON Cook, MD;  Location: [facility];  Service: Gynecology;  Laterality: N/A;   THYROIDECTOMY, PARTIAL     WISDOM TOOTH EXTRACTION      REVIEW OF SYSTEMS:   Review of Systems  Constitutional: Occasional fatigue. Negative for appetite change, chills, fever and unexpected weight change.  HENT: Negative for mouth sores, nosebleeds, sore throat and trouble swallowing.   Eyes: Negative for eye problems and icterus.  Respiratory: Negative for cough, hemoptysis, shortness of breath and wheezing.   Cardiovascular: Negative for chest pain and leg swelling.  Gastrointestinal: Positive for occasional GI upset and early satiety. Negative for constipation, diarrhea, nausea and vomiting.  Genitourinary: Negative for bladder incontinence, difficulty urinating, dysuria, frequency and hematuria.   Musculoskeletal: Negative for back pain, gait problem, neck pain and neck stiffness.  Skin: Negative for itching and rash.  Neurological: Negative for dizziness, extremity weakness, gait problem, headaches, light-headedness and seizures.  Hematological: Negative for adenopathy. Does not bruise/bleed easily.  Psychiatric/Behavioral: Negative for confusion, depression and sleep disturbance. The patient is not nervous/anxious.    PHYSICAL EXAMINATION:  Blood pressure 137/73, pulse 80, temperature 97.6 F (36.4 C), temperature source Temporal, resp. rate 17, height 5' 4 (1.626 m),  weight 178 lb 3.2 oz (80.8 kg), last menstrual period 08/08/2012, SpO2 100%.  ECOG PERFORMANCE STATUS: 1  Physical Exam  Constitutional: Oriented to person, place, and time and well-developed, well-nourished, and in no distress.  HENT:  Head: Normocephalic and atraumatic.  Mouth/Throat: Oropharynx is clear and moist. No oropharyngeal exudate.  Eyes: Conjunctivae are normal. Right eye exhibits no discharge. Left eye exhibits no discharge. No scleral icterus.  Neck: Normal range of motion. Neck supple.  Cardiovascular: Normal rate, regular rhythm, normal heart sounds and intact distal pulses.   Pulmonary/Chest: Effort normal and breath sounds normal. No respiratory distress. No wheezes. No rales.  Abdominal: Soft. Bowel sounds are normal. Exhibits no distension and no mass. There is no tenderness.  Musculoskeletal: Normal range of motion. Exhibits no edema.  Lymphadenopathy:    No cervical adenopathy.  Neurological: Alert and oriented to person, place, and time. Exhibits normal muscle tone. Gait normal. Coordination normal.  Skin: Skin is warm and dry. No rash noted. Not diaphoretic. No erythema. No pallor.  Psychiatric: Mood, memory and judgment normal.  Vitals reviewed.  LABORATORY  DATA: Lab Results  Component Value Date   WBC 5.8 08/26/2024   HGB 13.2 08/26/2024   HCT 39.6 08/26/2024   MCV 82.0 08/26/2024   PLT 97 (L) 08/26/2024      Chemistry      Component Value Date/Time   NA 140 07/28/2024 1302   K 4.2 07/28/2024 1302   CL 106 07/28/2024 1302   CO2 25 07/28/2024 1302   BUN 8 07/28/2024 1302   CREATININE 1.03 (H) 07/28/2024 1302   CREATININE 0.97 06/09/2020 1205      Component Value Date/Time   CALCIUM 9.5 07/28/2024 1302   ALKPHOS 98 07/28/2024 1302   AST 40 07/28/2024 1302   ALT 16 07/28/2024 1302   BILITOT 1.1 07/28/2024 1302       RADIOGRAPHIC STUDIES:  US  Abdomen Complete Result Date: 08/18/2024 CLINICAL DATA:  New thrombocytopenia. Possible  splenomegaly. Possible fatty liver disease. EXAM: ABDOMEN ULTRASOUND COMPLETE COMPARISON:  MRI breast 07/18/2024. CT abdomen pelvis 06/03/2010. Ultrasound abdomen complete 10/18/2009. FINDINGS: Gallbladder: Status post cholecystectomy. Common bile duct: Diameter: 4 mm Liver: No focal lesion. Mildly coarsened parenchymal echogenicity with subtle nodularity of hepatic contour suggestive of cirrhosis. Portal vein is patent on color Doppler imaging with normal direction of blood flow towards the liver. IVC: No abnormality visualized. Pancreas: Visualized portion unremarkable. Spleen: Spleen is enlarged measuring up to 15.1 cm in greatest dimension with a volume of 901 CC. 3.8 x 1.6 x 2.8 cm irregularly-shaped echogenic mass is seen within the spleen. Right Kidney: Length: 11.0 cm. Echogenicity within normal limits. No mass or hydronephrosis visualized. Left Kidney: Length: 11.0 cm. Echogenicity within normal limits. No mass or hydronephrosis visualized. Abdominal aorta: No aneurysm visualized. Other findings: None. IMPRESSION: 1.  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 2. Splenomegaly with volume measuring 901 CC. 3. Indeterminate echogenic splenic lesion measuring 3.8 x 1.6 x 2.8 cm. Further evaluation with contrast-enhanced abdominal MRI should be performed. Electronically Signed   By: Aliene Lloyd M.D.   On: 08/18/2024 12:46   MM RT BREAST BX W LOC DEV 1ST LESION IMAGE BX SPEC STEREO GUIDE Addendum Date: 08/18/2024 ADDENDUM REPORT: 08/18/2024 08:31 ADDENDUM: PATHOLOGY revealed: Breast, right, needle core biopsy, upper outer quadrant, coil clip- FIBROCYSTIC CHANGE WITH ADENOSIS AND CALCIFICATIONS- NEGATIVE FOR CARCINOMA Pathology results are CONCORDANT with imaging findings, per Dr. Alm Parkins. Pathology results and recommendations below were discussed with patient by telephone. Patient reported biopsy site doing well with no adverse symptoms, and only slight tenderness at the site. Post biopsy  care instructions were reviewed, questions were answered and my direct phone number was provided. Patient was instructed to call [facility] for any additional questions or concerns related to biopsy site. RECOMMENDATION: Patient instructed to resume annual bilateral screening mammogram when due. Pathology results reported by Mliss CHARM Molt RN 08/15/2024. Electronically Signed   By: Alm Parkins M.D.   On: 08/18/2024 08:31   Result Date: 08/18/2024 CLINICAL DATA:  Patient presents for stereotactic core needle biopsy of right breast calcifications. EXAM: RIGHT BREAST STEREOTACTIC CORE NEEDLE BIOPSY COMPARISON:  Previous exam(s). FINDINGS: The patient and I discussed the procedure of stereotactic-guided biopsy including benefits and alternatives. We discussed the high likelihood of a successful procedure. We discussed the risks of the procedure including infection, bleeding, tissue injury, clip migration, and inadequate sampling. Informed written consent was given. The usual time out protocol was performed immediately prior to the procedure. Using sterile technique and 1% Lidocaine  as local anesthetic, under stereotactic guidance,  a 9 gauge vacuum assisted device was used to perform core needle biopsy of calcifications in the upper outer quadrant of the right breast using a superior approach. Specimen radiograph was performed showing calcifications for which biopsy was performed. Specimens with calcifications are identified for pathology. Lesion quadrant: Upper outer quadrant At the conclusion of the procedure, a coil shaped tissue marker clip was deployed into the biopsy cavity. Follow-up 2-view mammogram was performed and dictated separately. IMPRESSION: Stereotactic-guided biopsy of right breast calcifications. No apparent complications. Electronically Signed: By: Alm Parkins M.D. On: 08/13/2024 14:02   MM CLIP PLACEMENT RIGHT Result Date: 08/13/2024 CLINICAL DATA:  Assess post biopsy  marker clip placement following stereotactic core needle biopsy of right breast calcifications. EXAM: 3D DIAGNOSTIC RIGHT MAMMOGRAM POST STEREOTACTIC BIOPSY COMPARISON:  Previous exam(s). ACR Breast Density Category c: The breasts are heterogeneously dense, which may obscure small masses. FINDINGS: 3D Mammographic images were obtained following stereotactic guided biopsy of right breast calcifications. The biopsy marking clip is in expected position at the site of biopsy. IMPRESSION: Appropriate positioning of the coil shaped biopsy marking clip at the site of biopsy in the lateral right breast in the expected location of the biopsied calcifications. Final Assessment: Post Procedure Mammograms for Marker Placement Electronically Signed   By: Alm Parkins M.D.   On: 08/13/2024 14:18   MM Digital Diagnostic Unilat R Result Date: 07/31/2024 CLINICAL DATA:  Callback from screening mammogram for possible calcifications right breast. Family history of breast cancer. EXAM: DIGITAL DIAGNOSTIC UNILATERAL RIGHT MAMMOGRAM TECHNIQUE: Right digital diagnostic mammography was performed. COMPARISON:  Previous exam(s). ACR Breast Density Category c: The breasts are heterogeneously dense, which may obscure small masses. FINDINGS: Lateral view of right breast, spot magnification cc and lateral views of right breast are submitted. There is a group of indeterminate microcalcifications in the lateral posterior right breast measuring 0.5 x 0.6 cm. These calcifications are better seen on the spot magnification right cc view. IMPRESSION: Suspicious findings. RECOMMENDATION: Recommend stereotactic core biopsy of right breast calcifications. I have discussed the findings and recommendations with the patient. If applicable, a reminder letter will be sent to the patient regarding the next appointment. BI-RADS CATEGORY  4: Suspicious. Electronically Signed   By: Craig Farr M.D.   On: 07/31/2024 13:49     ASSESSMENT/PLAN:  This is a  very pleasant 54 year old female referred to the clinic for thrombocytopenia.  Ultrasound of the liver showed splenomegaly and cirrhosis which could explain her thrombocytopenia.  The patient was seen with Dr. Sherrod.  Her Sherrod explained that her cirrhosis and splenomegaly could cause thrombocytopenia.  No management of thrombocytopenia is needed with her platelet count of 97,000 today.  The ultrasound showed a splenic lesion of unclear etiology as well as cirrhosis.  Will arrange for an MRI of the abdomen to further evaluate this. Discussed relationship between splenomegaly, liver disease, and thrombocytopenia.  We will fax a copy of her ultrasound to her gastroenterologist.  We strongly encouraged her to see gastroenterology for close monitoring of her cirrhosis and management and further evaluation.  Perhaps her splenomegaly is contributing to her early satiety.  The patient was advised to avoid alcohol and medications that are metabolized through the liver.  She already does not drink alcohol.  No specific management is needed for her thrombocytopenia at this time.  Would recommend treatment of her platelet count were less than 30,000 or concerns with bleeding.  See her back for follow-up visit in 1 month to review her MRI  of abdomen to evaluate the splenic lesion.   The patient voices understanding of current disease status and treatment options and is in agreement with the current care plan.  The patient was advised to call immediately if she has any concerning symptoms in the interval. The patient voices understanding of current disease status and treatment options and is in agreement with the current care plan. All questions were answered. The patient knows to call the clinic with any problems, questions or concerns. We can certainly see the patient much sooner if necessary    Orders Placed This Encounter  Procedures   MR Abdomen W Wo Contrast    Standing Status:   Future     Expected Date:   09/02/2024    Expiration Date:   08/26/2025    If indicated for the ordered procedure, I authorize the administration of contrast media per Radiology protocol:   Yes    What is the patient's sedation requirement?:   No Sedation    Does the patient have a pacemaker or implanted devices?:   No    Preferred imaging location?:   [facility] (table limit-550lbs)   CBC with Differential (Cancer Center Only)    Standing Status:   Future    Expected Date:   09/26/2024    Expiration Date:   08/26/2025      Toula Miyasaki L Kamill Fulbright, PA-C 08/26/2024  ADDENDUM: Hematology/Oncology Attending: I had a face-to-face encounter with the patient today.  I reviewed her records, lab and recommended her care plan.  This is a very pleasant 54 years old white female with thrombocytopenia likely related to liver disease and cirrhosis.  She had extensive blood work recently that was unremarkable except for slight elevation of her rheumatoid factor.  But her ANA, HIV, folic acid , hepatitis and B12 were normal.  She had ultrasound of the abdomen that showed chronic liver morphology with focal lesion in the spleen and splenomegaly.  I recommended for the patient to have MRI of the abdomen to rule out any concerning findings in the spleen.  Repeat CBC today showed stable but low platelets count and the patient is currently asymptomatic. We will see her back for follow-up visit in 1 months for evaluation and further recommendation regarding her condition based on the MRI results.  She was advised to call immediately if she has any other concerning symptoms in the interval. Disclaimer: This note was dictated with voice recognition software. Similar sounding words can inadvertently be transcribed and may be missed upon review. Sherrod MARLA Sherrod, MD

## 2024-08-26 ENCOUNTER — Telehealth: Payer: Self-pay

## 2024-08-26 ENCOUNTER — Inpatient Hospital Stay: Attending: Physician Assistant

## 2024-08-26 ENCOUNTER — Inpatient Hospital Stay: Admitting: Physician Assistant

## 2024-08-26 VITALS — BP 137/73 | HR 80 | Temp 97.6°F | Resp 17 | Ht 64.0 in | Wt 178.2 lb

## 2024-08-26 DIAGNOSIS — R6881 Early satiety: Secondary | ICD-10-CM | POA: Insufficient documentation

## 2024-08-26 DIAGNOSIS — R161 Splenomegaly, not elsewhere classified: Secondary | ICD-10-CM

## 2024-08-26 DIAGNOSIS — D696 Thrombocytopenia, unspecified: Secondary | ICD-10-CM | POA: Diagnosis not present

## 2024-08-26 DIAGNOSIS — K7460 Unspecified cirrhosis of liver: Secondary | ICD-10-CM | POA: Insufficient documentation

## 2024-08-26 DIAGNOSIS — R5383 Other fatigue: Secondary | ICD-10-CM | POA: Insufficient documentation

## 2024-08-26 DIAGNOSIS — K3184 Gastroparesis: Secondary | ICD-10-CM | POA: Diagnosis not present

## 2024-08-26 DIAGNOSIS — S3609XS Other injury of spleen, sequela: Secondary | ICD-10-CM

## 2024-08-26 LAB — CBC WITH DIFFERENTIAL (CANCER CENTER ONLY)
Abs Immature Granulocytes: 0.01 K/uL (ref 0.00–0.07)
Basophils Absolute: 0 K/uL (ref 0.0–0.1)
Basophils Relative: 1 %
Eosinophils Absolute: 0.1 K/uL (ref 0.0–0.5)
Eosinophils Relative: 2 %
HCT: 39.6 % (ref 36.0–46.0)
Hemoglobin: 13.2 g/dL (ref 12.0–15.0)
Immature Granulocytes: 0 %
Lymphocytes Relative: 36 %
Lymphs Abs: 2.1 K/uL (ref 0.7–4.0)
MCH: 27.3 pg (ref 26.0–34.0)
MCHC: 33.3 g/dL (ref 30.0–36.0)
MCV: 82 fL (ref 80.0–100.0)
Monocytes Absolute: 0.5 K/uL (ref 0.1–1.0)
Monocytes Relative: 8 %
Neutro Abs: 3.1 K/uL (ref 1.7–7.7)
Neutrophils Relative %: 53 %
Platelet Count: 97 K/uL — ABNORMAL LOW (ref 150–400)
RBC: 4.83 MIL/uL (ref 3.87–5.11)
RDW: 14.4 % (ref 11.5–15.5)
WBC Count: 5.8 K/uL (ref 4.0–10.5)
nRBC: 0 % (ref 0.0–0.2)

## 2024-08-26 NOTE — Telephone Encounter (Signed)
 Faxed US  of the abdomen to [facility] gastroenterology with confirmation at [phone].

## 2024-09-01 ENCOUNTER — Other Ambulatory Visit (HOSPITAL_COMMUNITY): Payer: Self-pay

## 2024-09-01 ENCOUNTER — Other Ambulatory Visit: Payer: Self-pay

## 2024-09-02 ENCOUNTER — Encounter: Payer: Self-pay | Admitting: Physician Assistant

## 2024-09-02 ENCOUNTER — Other Ambulatory Visit: Payer: Self-pay

## 2024-09-11 ENCOUNTER — Other Ambulatory Visit (HOSPITAL_COMMUNITY): Payer: Self-pay

## 2024-09-11 ENCOUNTER — Other Ambulatory Visit: Payer: Self-pay

## 2024-09-12 ENCOUNTER — Other Ambulatory Visit: Payer: Self-pay

## 2024-09-12 ENCOUNTER — Other Ambulatory Visit (HOSPITAL_COMMUNITY): Payer: Self-pay

## 2024-09-12 MED ORDER — PRUCALOPRIDE SUCCINATE 2 MG PO TABS
1.0000 | ORAL_TABLET | Freq: Every day | ORAL | 3 refills | Status: AC
  Filled 2024-09-12 – 2024-09-15 (×2): qty 90, 90d supply, fill #0

## 2024-09-12 MED ORDER — FAMOTIDINE 40 MG PO TABS
40.0000 mg | ORAL_TABLET | Freq: Every day | ORAL | 3 refills | Status: AC
  Filled 2024-09-12 – 2024-09-15 (×2): qty 90, 90d supply, fill #0

## 2024-09-12 MED ORDER — DICYCLOMINE HCL 10 MG PO CAPS
10.0000 mg | ORAL_CAPSULE | Freq: Three times a day (TID) | ORAL | 3 refills | Status: AC | PRN
  Filled 2024-09-12: qty 270, 90d supply, fill #0

## 2024-09-12 MED ORDER — ONDANSETRON 4 MG PO TBDP
4.0000 mg | ORAL_TABLET | Freq: Three times a day (TID) | ORAL | 1 refills | Status: AC | PRN
  Filled 2024-09-12 – 2024-09-15 (×2): qty 180, 60d supply, fill #0

## 2024-09-13 ENCOUNTER — Other Ambulatory Visit (HOSPITAL_COMMUNITY): Payer: Self-pay

## 2024-09-14 ENCOUNTER — Other Ambulatory Visit

## 2024-09-14 ENCOUNTER — Other Ambulatory Visit (HOSPITAL_COMMUNITY): Payer: Self-pay

## 2024-09-14 MED ORDER — LEVOTHYROXINE SODIUM 25 MCG PO TABS
25.0000 ug | ORAL_TABLET | Freq: Every morning | ORAL | 4 refills | Status: AC
  Filled 2024-09-14: qty 90, 90d supply, fill #0

## 2024-09-15 ENCOUNTER — Other Ambulatory Visit (HOSPITAL_COMMUNITY): Payer: Self-pay

## 2024-09-15 ENCOUNTER — Other Ambulatory Visit: Payer: Self-pay

## 2024-09-16 ENCOUNTER — Other Ambulatory Visit: Payer: Self-pay

## 2024-09-16 ENCOUNTER — Other Ambulatory Visit (HOSPITAL_COMMUNITY): Payer: Self-pay

## 2024-09-16 MED ORDER — TRIAMCINOLONE ACETONIDE 0.1 % EX LOTN
TOPICAL_LOTION | CUTANEOUS | 11 refills | Status: AC
  Filled 2024-09-16: qty 60, 30d supply, fill #0

## 2024-09-25 ENCOUNTER — Inpatient Hospital Stay: Admitting: Internal Medicine

## 2024-09-25 ENCOUNTER — Inpatient Hospital Stay: Attending: Physician Assistant

## 2024-10-11 ENCOUNTER — Other Ambulatory Visit
